# Patient Record
Sex: Female | Born: 1945
Health system: Southern US, Community
[De-identification: ages and names within clinical notes are randomized; demographics above are authoritative.]

## PROBLEM LIST (undated history)

## (undated) DIAGNOSIS — I2510 Atherosclerotic heart disease of native coronary artery without angina pectoris: Secondary | ICD-10-CM

## (undated) DIAGNOSIS — F32A Depression, unspecified: Secondary | ICD-10-CM

## (undated) DIAGNOSIS — M1990 Unspecified osteoarthritis, unspecified site: Secondary | ICD-10-CM

## (undated) DIAGNOSIS — K635 Polyp of colon: Secondary | ICD-10-CM

## (undated) DIAGNOSIS — D689 Coagulation defect, unspecified: Secondary | ICD-10-CM

## (undated) DIAGNOSIS — T7840XA Allergy, unspecified, initial encounter: Secondary | ICD-10-CM

## (undated) DIAGNOSIS — G4730 Sleep apnea, unspecified: Secondary | ICD-10-CM

## (undated) DIAGNOSIS — K219 Gastro-esophageal reflux disease without esophagitis: Secondary | ICD-10-CM

## (undated) DIAGNOSIS — I10 Essential (primary) hypertension: Secondary | ICD-10-CM

## (undated) DIAGNOSIS — K5790 Diverticulosis of intestine, part unspecified, without perforation or abscess without bleeding: Secondary | ICD-10-CM

## (undated) DIAGNOSIS — E079 Disorder of thyroid, unspecified: Secondary | ICD-10-CM

## (undated) DIAGNOSIS — K648 Other hemorrhoids: Secondary | ICD-10-CM

## (undated) DIAGNOSIS — F419 Anxiety disorder, unspecified: Secondary | ICD-10-CM

## (undated) DIAGNOSIS — F329 Major depressive disorder, single episode, unspecified: Secondary | ICD-10-CM

## (undated) DIAGNOSIS — D126 Benign neoplasm of colon, unspecified: Secondary | ICD-10-CM

## (undated) DIAGNOSIS — K602 Anal fissure, unspecified: Secondary | ICD-10-CM

## (undated) DIAGNOSIS — E785 Hyperlipidemia, unspecified: Secondary | ICD-10-CM

## (undated) HISTORY — DX: Atherosclerotic heart disease of native coronary artery without angina pectoris: I25.10

## (undated) HISTORY — DX: Diverticulosis of intestine, part unspecified, without perforation or abscess without bleeding: K57.90

## (undated) HISTORY — DX: Essential (primary) hypertension: I10

## (undated) HISTORY — DX: Anxiety disorder, unspecified: F41.9

## (undated) HISTORY — DX: Gastro-esophageal reflux disease without esophagitis: K21.9

## (undated) HISTORY — DX: Sleep apnea, unspecified: G47.30

## (undated) HISTORY — DX: Coagulation defect, unspecified: D68.9

## (undated) HISTORY — DX: Other hemorrhoids: K64.8

## (undated) HISTORY — DX: Depression, unspecified: F32.A

## (undated) HISTORY — DX: Unspecified osteoarthritis, unspecified site: M19.90

## (undated) HISTORY — DX: Benign neoplasm of colon, unspecified: D12.6

## (undated) HISTORY — DX: Allergy, unspecified, initial encounter: T78.40XA

## (undated) HISTORY — DX: Anal fissure, unspecified: K60.2

## (undated) HISTORY — DX: Polyp of colon: K63.5

## (undated) HISTORY — PX: OTHER SURGICAL HISTORY: SHX169

## (undated) HISTORY — DX: Disorder of thyroid, unspecified: E07.9

## (undated) HISTORY — DX: Hyperlipidemia, unspecified: E78.5

## (undated) HISTORY — PX: COLONOSCOPY: SHX174

---
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
## 2019-05-26 ENCOUNTER — Ambulatory Visit (INDEPENDENT_AMBULATORY_CARE_PROVIDER_SITE_OTHER): Payer: Self-pay | Admitting: Primary Care

## 2019-05-26 ENCOUNTER — Other Ambulatory Visit: Payer: Self-pay

## 2019-05-26 ENCOUNTER — Encounter: Payer: Self-pay | Admitting: Primary Care

## 2019-05-26 VITALS — BP 142/92 | HR 69 | Temp 97.7°F | Ht 64.5 in | Wt 152.8 lb

## 2019-05-26 DIAGNOSIS — M79672 Pain in left foot: Secondary | ICD-10-CM

## 2019-05-26 DIAGNOSIS — E039 Hypothyroidism, unspecified: Secondary | ICD-10-CM

## 2019-05-26 DIAGNOSIS — F411 Generalized anxiety disorder: Secondary | ICD-10-CM

## 2019-05-26 DIAGNOSIS — I10 Essential (primary) hypertension: Secondary | ICD-10-CM

## 2019-05-26 DIAGNOSIS — G8929 Other chronic pain: Secondary | ICD-10-CM | POA: Insufficient documentation

## 2019-05-26 LAB — COMPREHENSIVE METABOLIC PANEL
ALT: 13 U/L (ref 0–35)
AST: 15 U/L (ref 0–37)
Albumin: 4.6 g/dL (ref 3.5–5.2)
Alkaline Phosphatase: 73 U/L (ref 39–117)
BUN: 15 mg/dL (ref 6–23)
CO2: 27 mEq/L (ref 19–32)
Calcium: 9.8 mg/dL (ref 8.4–10.5)
Chloride: 102 mEq/L (ref 96–112)
Creatinine, Ser: 0.87 mg/dL (ref 0.40–1.20)
GFR: 63.59 mL/min (ref >60.00)
Glucose, Bld: 102 mg/dL — ABNORMAL HIGH (ref 70–99)
Potassium: 5.6 mEq/L — ABNORMAL HIGH (ref 3.5–5.1)
Sodium: 137 mEq/L (ref 135–145)
Total Bilirubin: 0.6 mg/dL (ref 0.2–1.2)
Total Protein: 6.6 g/dL (ref 6.0–8.3)

## 2019-05-26 LAB — LIPID PANEL
Cholesterol: 265 mg/dL — ABNORMAL HIGH (ref 0–200)
HDL: 83.6 mg/dL (ref >39.00)
LDL Cholesterol: 152 mg/dL — ABNORMAL HIGH (ref 0–99)
NonHDL: 181.86
Total CHOL/HDL Ratio: 3
Triglycerides: 150 mg/dL — ABNORMAL HIGH (ref 0.0–149.0)
VLDL: 30 mg/dL (ref 0.0–40.0)

## 2019-05-26 LAB — TSH: TSH: 1.59 u[IU]/mL (ref 0.35–4.50)

## 2019-05-26 MED ORDER — IRBESARTAN 75 MG PO TABS: 75.0000 mg | ORAL_TABLET | Freq: Every day | ORAL | 1 refills | Status: DC

## 2019-05-26 MED ORDER — SERTRALINE HCL 25 MG PO TABS: 25.0000 mg | ORAL_TABLET | Freq: Every day | ORAL | 1 refills | Status: DC

## 2019-05-26 NOTE — Progress Notes (Signed)
Subjective:    Patient ID: Bethany Sharp, female    DOB: 02/11/1946, 73 y.o.   MRN: [id number]  HPI  Bethany Sharp is a 73 year old female who presents today to establish care and discuss the problems mentioned below. Will obtain/review records. She speaks [place] mostly, her son is with her today to translate.   1) Essential Hypertension: Currently managed on irbesartan 75 mg daily. She ran out of her irbesartan one week ago.  She denies chest pain, headaches, dizziness.  BP Readings from Last 3 Encounters:  05/26/19 (!) 142/92   2) Hypothyroidism: Diagnosed originally 10 years ago. History of two thyroid nodules. Currently managed on levothyroxine 50 mcg. She has been managed on this same dose for years. She takes her levothyroxine in the morning with her other medications with her breakfast.   3) GERD: Currently managed on Nexium 20 mg for which she takes daily.   4) Chronic Foot Pain: Chronic to dorsal and plantar feet for years, takes Advil as needed. History of surgical intervention years ago, cartilage removed.   5) Chronic Anxiety: Previously managed on Bromazepam, now managed on alprazolam 0.25 mg for which she takes 1/2 tablet once to twice daily. She struggles with daily anxiety since moving to the [place] in October 2019.  Symptoms include feeling nervous, anxious, stressed. She has not been treated with SSRI medications in the past.   Review of Systems  Eyes: Negative for visual disturbance.  Respiratory: Negative for shortness of breath.   Cardiovascular: Negative for chest pain.  Neurological: Negative for dizziness and headaches.  Psychiatric/Behavioral: The patient is nervous/anxious.        Past Medical History:  Diagnosis Date  . Arthritis   . Depression   . Hyperlipidemia   . Thyroid disease      Social History   Socioeconomic History  . Marital status: Divorced    Spouse name: Not on file  . Number of children: Not on file  . Years of education: Not  on file  . Highest education level: Not on file  Occupational History  . Not on file  Social Needs  . Financial resource strain: Not on file  . Food insecurity    Worry: Not on file    Inability: Not on file  . Transportation needs    Medical: Not on file    Non-medical: Not on file  Tobacco Use  . Smoking status: Current Every Day Smoker  . Smokeless tobacco: Never Used  Substance and Sexual Activity  . Alcohol use: Yes  . Drug use: Not on file  . Sexual activity: Not on file  Lifestyle  . Physical activity    Days per week: Not on file    Minutes per session: Not on file  . Stress: Not on file  Relationships  . Social Herbalist on phone: Not on file    Gets together: Not on file    Attends religious service: Not on file    Active member of club or organization: Not on file    Attends meetings of clubs or organizations: Not on file    Relationship status: Not on file  . Intimate partner violence    Fear of current or ex partner: Not on file    Emotionally abused: Not on file    Physically abused: Not on file    Forced sexual activity: Not on file  Other Topics Concern  . Not on file  Social History  Narrative  . Not on file     No family history on file.  No Known Allergies  Current Outpatient Medications on File Prior to Visit  Medication Sig Dispense Refill  . levothyroxine (SYNTHROID) 50 MCG tablet Take 50 mcg by mouth daily before breakfast.     No current facility-administered medications on file prior to visit.     BP (!) 142/92   Pulse 69   Temp 97.7 F (36.5 C) (Temporal)   Ht 5' 4.5" (1.638 m)   Wt 152 lb 12 oz (69.3 kg)   SpO2 97%   BMI 25.81 kg/m    Objective:   Physical Exam  Constitutional: She appears well-nourished.  Neck: Neck supple.  Cardiovascular: Normal rate and regular rhythm.  Respiratory: Effort normal and breath sounds normal.  Skin: Skin is warm and dry.  Psychiatric: She has a normal mood and affect.            Assessment & Plan:

## 2019-05-26 NOTE — Assessment & Plan Note (Signed)
Chronic for years, history of foot surgery in [place] years ago.  Does well with as needed Advil.

## 2019-05-26 NOTE — Assessment & Plan Note (Signed)
Above goal in the office today, has been out of ibesartan for the last 1 week.  Refill of I restarted provided, although she does not have insurance so we may need to send her medications to [facility] and consider something on the $4 list.  Her son will update.

## 2019-05-26 NOTE — Assessment & Plan Note (Signed)
Chronic for years, has been on Xanax for 10 years total.  Discussed that daily benzo use is not appropriate for daily anxiety symptoms.  She will wean off of Xanax, instructions provided.  Start Zoloft 25 mg daily. Patient is to take 1/2 tablet daily for 8 days, then advance to 1 full tablet thereafter. We discussed possible side effects of headache, GI upset, drowsiness, and SI/HI. If thoughts of SI/HI develop, we discussed to present to the emergency immediately. Patient verbalized understanding.   She will update [place] in 4-6 weeks.

## 2019-05-26 NOTE — Patient Instructions (Signed)
Commencez  vous sevrer de Xanax. Prenez 1/2 comprim tous les deux soirs pendant deux semaines, puis arrtez.  Commencez par comprims de sertraline (Zoloft)  25 mg une fois par [employer]. Commencez par prendre 1/2 comprim par jour pendant six jours, puis augmentez  1 comprim complet par Insurance underwriter.  J'ai envoy une recharge de vos comprims d'irbsartan 75 mg  la pharmacie.  Prenez votre lvothyroxine (mdicament contre la thyrode) chaque matin  jeun avec de l'eau uniquement. Pas de nourriture ou d'autres mdicaments pendant 30 minutes.  Arrtez-vous  notre laboratoire aujourd'hui avant de [place]. Je vous appellerai avec vos rsultats de [employer] 1-2 jours.  Veuillez planifier une visite de suivi avec moi pendant six semaines pour [employer] de l'anxit.  C'tait trs agrable de vous rencontrer aujourd'hui!   Start to wean off of Xanax. Take 1/2 tablet every other night for two weeks, then stop.  Start sertraline (Zoloft) 25 mg tabelts once every morning for anxiety. Start by taking 1/2 tablet daily for six days, then increase to 1 full tablet thereafter.  I sent a refill of your irbesartan 75 mg tablets to the pharmacy.  Take your levothyroxine (Thyroid medication) every morning on an empty stomach with water only. No food or other medications for 30 minutes.  Stop by our lab today before you leave. I will call you with your lab results in 1-2 days.  Please schedule a follow up visit with me for six weeks to discuss anxiety.  It was lovely meeting you today!

## 2019-05-26 NOTE — Assessment & Plan Note (Signed)
Managed on levothyroxine 50 mcg for years without dose change.  She is taking her medication inappropriately, we discussed proper administration.  TSH pending today.  Will refill once TSH results have returned.  She has not run out of her medication.

## 2019-05-27 ENCOUNTER — Other Ambulatory Visit: Payer: Self-pay | Admitting: Primary Care

## 2019-05-27 DIAGNOSIS — E039 Hypothyroidism, unspecified: Secondary | ICD-10-CM

## 2019-05-27 MED ORDER — LEVOTHYROXINE SODIUM 50 MCG PO TABS: ORAL_TABLET | ORAL | 3 refills | Status: DC

## 2019-05-28 ENCOUNTER — Telehealth: Payer: Self-pay

## 2019-05-28 DIAGNOSIS — I10 Essential (primary) hypertension: Secondary | ICD-10-CM

## 2019-05-28 MED ORDER — LOSARTAN POTASSIUM 25 MG PO TABS: 25.0000 mg | ORAL_TABLET | Freq: Every day | ORAL | 1 refills | Status: DC

## 2019-05-28 NOTE — Telephone Encounter (Signed)
Please notify patient son that I sent in a prescription for losartan 25 mg to replace irbesartan. This was sent to [facility] and should be much more affordable. Would he like for [place] to send the levothyroxine to [facility] as well? What about the sertraline for anxiety?

## 2019-05-28 NOTE — Telephone Encounter (Signed)
Bethany Sharp (pt son's DPR signed) said that the BP med Irbesartan 75 mg is too expensive; Bethany Sharp said $75.00 for 1 month. Bethany Sharp understands there is a less expensive BP med at [facility] and request BP med be changed to less expensive med since pt has no ins. Please advise.

## 2019-05-28 NOTE — Telephone Encounter (Signed)
Spoke with patient's son and gave him all of the information.  He is going to contact [facility] and see if they can get the levothyroxine and sertraline any cheaper as well and let us know if we need to send new r/x's for those as well.  He thanks [place] for the call.

## 2020-03-27 ENCOUNTER — Ambulatory Visit: Payer: Self-pay | Attending: Internal Medicine

## 2020-03-27 DIAGNOSIS — Z20822 Contact with and (suspected) exposure to covid-19: Secondary | ICD-10-CM

## 2020-03-28 LAB — NOVEL CORONAVIRUS, NAA: SARS-CoV-2, NAA: NOT DETECTED

## 2020-03-28 LAB — SARS-COV-2, NAA 2 DAY TAT

## 2020-06-08 ENCOUNTER — Encounter: Payer: Self-pay | Admitting: Family Medicine

## 2020-06-08 ENCOUNTER — Other Ambulatory Visit: Payer: Self-pay

## 2020-06-08 ENCOUNTER — Ambulatory Visit: Payer: Self-pay | Admitting: Family Medicine

## 2020-06-08 ENCOUNTER — Ambulatory Visit (INDEPENDENT_AMBULATORY_CARE_PROVIDER_SITE_OTHER)
Admission: RE | Admit: 2020-06-08 | Discharge: 2020-06-08 | Disposition: A | Discharge status: Home / Self Care | Payer: Self-pay | Source: Ambulatory Visit | Attending: Family Medicine | Admitting: Family Medicine

## 2020-06-08 VITALS — BP 116/78 | HR 65 | Temp 97.6°F | Ht 64.0 in | Wt 154.0 lb

## 2020-06-08 DIAGNOSIS — M79672 Pain in left foot: Secondary | ICD-10-CM

## 2020-06-08 DIAGNOSIS — G8929 Other chronic pain: Secondary | ICD-10-CM

## 2020-06-08 DIAGNOSIS — M19072 Primary osteoarthritis, left ankle and foot: Secondary | ICD-10-CM

## 2020-06-08 MED ORDER — PREDNISONE 20 MG PO TABS: ORAL_TABLET | ORAL | 0 refills | Status: DC

## 2020-06-08 NOTE — Progress Notes (Addendum)
Bethany Kimbell T. Cailee Blanke, MD, [place]  [facility] and [place] at [facility] [place], [address]  Phone: [phone]  FAX: [phone]  Bethany Sharp - 74 y.o. female  MRN [id number]  Date of Birth: 07-May-1946  Date: 06/08/2020  PCP: [name], NP  Referral: [name], NP  Chief Complaint  Patient presents with  . Ankle Pain    left    This visit occurred during the SARS-CoV-2 public health emergency.  Safety protocols were in place, including screening questions prior to the visit, additional usage of staff PPE, and extensive cleaning of exam room while observing appropriate contact time as indicated for disinfecting solutions.   Subjective:   Bethany Sharp is a 74 y.o. very pleasant female patient with Body mass index is 26.43 kg/m. who presents with the following:  She presents with some chronic foot and ankle pain.  She is here with her son who provides additional history.  The patient speaks [place] and has low level command of English.  She has had some ongoing foot and ankle pain for years, and she did have what she describes as arthroscopy of the ankle with some debridement of cartilage while she was in [place] approximately 5 years ago.  She does have a history of having some flat feet long-term, and at this point she has worn some hard orthotics, but now they are more tender to palpation.  She did have a long trip to [place] recently and this was for several weeks, and this was approximately 6 weeks ago.  After her trip she has had quite a bit of pain predominantly in the midfoot.  S/p distant foot surgery in [place].   Went to [place] has been having problems as young and had an MRI.  S/p surgery distantly. 6 weeks ago.   Review of Systems is noted in the HPI, as appropriate   Objective:   BP 116/78   Pulse 65   Temp 97.6 F (36.4 C) (Temporal)   Ht 5\' 4"  (1.626 m)    Wt 154 lb (69.9 kg)   SpO2 97%   BMI 26.43 kg/m    GEN: No acute distress; alert,appropriate. PULM: Breathing comfortably in no respiratory distress PSYCH: Normally interactive.    Patient does have minimal tenderness at the toes as well as the forefoot.  She has had tenderness relatively diffusely at the tarsometatarsal joints.  She has no tenderness at the length of the fifth metatarsal.  She does have some tenderness predominantly at the joints throughout the midfoot and she has a predominantly large cuneiform.  She also has some tenderness with deep palpation at the talus.  She has quite a bit of significant loss of motion at the ankle.  Plantar flexion and dorsiflexion have approximately 50% loss of motion.  She has an approximate 35% loss of motion in eversion and inversion.  She also does have tenderness at the ankle as well as decreased strength in both flexion and dorsiflexion.  This is approximately 4 out of 5 in strength and worse in dorsiflexion.  Does have approximately 4+ strength in inversion and eversion.  These compared to the contralateral side which have full motion as well as 5/5 strength  Radiology: DG Foot Complete Left  Result Date: 06/08/2020 CLINICAL DATA:  Chronic foot pain EXAM: LEFT FOOT - COMPLETE 3+ VIEW COMPARISON:  None. FINDINGS: Mild degenerative changes at the first tarsal metatarsal articulation.  No acute fracture or dislocation is noted. No soft tissue abnormality is seen. Small calcaneal spurs are noted. Mild tibiotalar degenerative changes noted as well. IMPRESSION: Mild osteoarthritic changes without acute abnormality. Electronically Signed   By: Inez Catalina M.D.   On: 06/08/2020 18:33    I would characterize these as more of a moderate degree of osteoarthritis both at the ankle as well as in the midfoot. Electronically Signed  By: Owens Loffler, MD On: 06/08/2020 12:00 PM EDT   Assessment and Plan:     ICD-10-CM   1. Primary osteoarthritis,  left ankle and foot  M19.072 Ambulatory referral to Podiatry  2. Chronic foot pain, left  M79.672 DG Foot Complete Left   G89.29 Ambulatory referral to Podiatry   Clinically exacerbation of midfoot as well as ankle arthritis.  Exacerbated during trip to [place].  On my review, the patient has more moderate degree of midfoot as well as ankle osteoarthritis.  Clinically, the patient has very significant loss of motion and pain with terminal movements.  Work on ankle motion as she is able, she may be able to get a small degree back.  Acute prednisone to hopefully help with multiple joint involvement of the foot and ankle.  Addendum, 06/13/20 6:50 AM The patient's son called in today.  There was some confusion.  Bethany Sharp only speaks [place], and her son is also from [place].  Ultimately they decided that they wanted to see podiatry, but this was lost in translation.  I am happy to make this referral. Electronically Signed  By: Owens Loffler, MD On: 06/13/2020 6:50 AM   Follow-up: No follow-ups on file.  Meds ordered this encounter  Medications  . predniSONE (DELTASONE) 20 MG tablet    Sig: 2 tabs po daily for 5 days, then 1 tab po daily for 5 days    Dispense:  15 tablet    Refill:  0   Medications Discontinued During This Encounter  Medication Reason  . sertraline (ZOLOFT) 25 MG tablet Completed Course   Orders Placed This Encounter  Procedures  . DG Foot Complete Left  . Ambulatory referral to Podiatry    Signed,  Frederico Hamman T. Hebe Merriwether, MD   Outpatient Encounter Medications as of 06/08/2020  Medication Sig  . levothyroxine (SYNTHROID) 50 MCG tablet Take 1 tablet by mouth every morning on an empty stomach with water only.  No food or other medications for 30 minutes.  Marland Kitchen losartan (COZAAR) 25 MG tablet Take 1 tablet (25 mg total) by mouth daily. For blood pressure.  . predniSONE (DELTASONE) 20 MG tablet 2 tabs po daily for 5 days, then 1 tab po daily for 5 days  . [DISCONTINUED]  sertraline (ZOLOFT) 25 MG tablet Take 1 tablet (25 mg total) by mouth daily. For anxiety.   No facility-administered encounter medications on file as of 06/08/2020.

## 2020-06-08 NOTE — Patient Instructions (Signed)
Vous souffrez d'arthrite grave au pied et  la Henriette.  Pour le moment, je pense que nous devons faire quelque chose pour [employer] de manire prcise. Je vais vous donner de la prednisone. (strodes oraux). J'espre que cela pourra calmer l'inflammation plus rcente.   long terme, je pense que prendre de l'ibuprofne ou du tylenol quand a fait mal peut aider quand a agit.  Vous pouvez galement essayer le gel Voltaren en vente libre. C'est un vieil anti-inflammatoire qui est en suspension dans l'alcool, et que vous [employer] votre pied et votre cheville par voie topique.  Portez des chaussures confortables - tout ce qui vous Public relations account executive.

## 2020-06-12 ENCOUNTER — Telehealth: Payer: Self-pay | Admitting: Primary Care

## 2020-06-12 NOTE — Telephone Encounter (Signed)
I do not see that a referral has been ordered.  Will forward to Dr. Lorelei Pont.

## 2020-06-12 NOTE — Telephone Encounter (Signed)
Son Company secretary) called checking on referral to foot dr for pt for orthopedics. For foot pain   Please advise

## 2020-06-13 NOTE — Addendum Note (Signed)
Addended by: Owens Loffler on: 06/13/2020 06:50 AM   Modules accepted: Orders

## 2020-06-13 NOTE — Telephone Encounter (Signed)
There was some confusion here.  Native language is [place], so I think this may have been lost in translation.  Her son did mention possibility of podiatry evaluation, but I did not think that they wanted this right away.  Hopefully my recommendations will help, but it is entirely reasonable to discuss with podiatry.  I made this referral.

## 2020-06-30 ENCOUNTER — Other Ambulatory Visit: Payer: Self-pay

## 2020-06-30 ENCOUNTER — Ambulatory Visit (INDEPENDENT_AMBULATORY_CARE_PROVIDER_SITE_OTHER): Payer: Self-pay | Admitting: Podiatry

## 2020-06-30 ENCOUNTER — Encounter: Payer: Self-pay | Admitting: Podiatry

## 2020-06-30 DIAGNOSIS — Q666 Other congenital valgus deformities of feet: Secondary | ICD-10-CM

## 2020-06-30 DIAGNOSIS — M79673 Pain in unspecified foot: Secondary | ICD-10-CM

## 2020-07-05 ENCOUNTER — Encounter: Payer: Self-pay | Admitting: Podiatry

## 2020-07-05 NOTE — Progress Notes (Signed)
  Subjective:  Patient ID: Bethany Sharp, female    DOB: 1946-08-20,  MRN: [id number]  Chief Complaint  Patient presents with  . Foot Problem    the left foot has some arthritis and need some inserts made     74 y.o. female presents with the above complaint.  Patient presents with complaint of bilateral generalized foot pain.  Patient states that it hurts in the arch and the heel.  Patient states that is more dull achy in nature.  She states that happens injury at the end of the day.  She states that she would like to discuss shoe gear modification as well as inserts to help support the arch.  She usually stands in her feet all day.  She has presented today with a translator in the room.  She denies any other acute complaints.  She has not seen anyone else prior to seeing me.   Review of Systems: Negative except as noted in the HPI. Denies N/V/F/Ch.  Past Medical History:  Diagnosis Date  . Arthritis   . Depression   . Hyperlipidemia   . Thyroid disease     Current Outpatient Medications:  .  levothyroxine (SYNTHROID) 50 MCG tablet, Take 1 tablet by mouth every morning on an empty stomach with water only.  No food or other medications for 30 minutes., Disp: 90 tablet, Rfl: 3 .  losartan (COZAAR) 25 MG tablet, Take 1 tablet (25 mg total) by mouth daily. For blood pressure., Disp: 90 tablet, Rfl: 1 .  predniSONE (DELTASONE) 20 MG tablet, 2 tabs po daily for 5 days, then 1 tab po daily for 5 days, Disp: 15 tablet, Rfl: 0  Social History   Tobacco Use  Smoking Status Current Every Day Smoker  Smokeless Tobacco Never Used    No Known Allergies Objective:  There were no vitals filed for this visit. There is no height or weight on file to calculate BMI. Constitutional Well developed. Well nourished.  Vascular Dorsalis pedis pulses palpable bilaterally. Posterior tibial pulses palpable bilaterally. Capillary refill normal to all digits.  No cyanosis or clubbing noted. Pedal hair  growth normal.  Neurologic Normal speech. Oriented to person, place, and time. Epicritic sensation to light touch grossly present bilaterally.  Dermatologic Nails well groomed and normal in appearance. No open wounds. No skin lesions.  Orthopedic:  Subjective generalized pain on palpation/achiness noted to bilateral arches as well as the heel.  No concern for plantar fasciitis.  No Achilles tendinitis.  No peroneal, ATFL, posterior tibial pain.   Radiographs: None Assessment:   1. Pes planovalgus   2. Arch pain, unspecified laterality    Plan:  Patient was evaluated and treated and all questions answered.  Bilateral semiflexible pes planus -I explained patient the etiology of pes planus deformity and various treatment options were extensively discussed with the patient.  Given that she is having generalized arch pain as well as muscle ache especially at the end of the day I believe she will benefit from custom-made orthotics to help provide general support to the arches of the foot as well as control the hindfoot motion.  Patient agrees with the plan would like to proceed with custom-made orthotics. -She will be scheduled to see [facility] for custom-made orthotics.  She is aware of the financial cost.  No follow-ups on file.

## 2020-07-06 ENCOUNTER — Ambulatory Visit: Payer: Self-pay | Admitting: Orthotics

## 2020-07-06 ENCOUNTER — Other Ambulatory Visit: Payer: Self-pay

## 2020-07-06 DIAGNOSIS — Q666 Other congenital valgus deformities of feet: Secondary | ICD-10-CM

## 2020-07-06 DIAGNOSIS — M79673 Pain in unspecified foot: Secondary | ICD-10-CM

## 2020-07-06 NOTE — Progress Notes (Signed)
Cast today for f/o to address pes planovalgus, early stages of PTTD.   Plan on deep heel seat and 4* varus RF wedging to take pressure off PTT at navicular insertion.

## 2020-08-03 ENCOUNTER — Other Ambulatory Visit: Payer: Self-pay | Admitting: Orthotics

## 2020-08-24 ENCOUNTER — Ambulatory Visit (INDEPENDENT_AMBULATORY_CARE_PROVIDER_SITE_OTHER): Payer: Self-pay | Admitting: Orthotics

## 2020-08-24 ENCOUNTER — Other Ambulatory Visit: Payer: Self-pay

## 2020-08-24 DIAGNOSIS — M79673 Pain in unspecified foot: Secondary | ICD-10-CM

## 2020-08-24 NOTE — Progress Notes (Signed)
Make following adjustments: 1) more narrow, 2) no post 3) dress slim 4) medial skive 5) blue swirl 6) size 5.5.

## 2020-09-14 ENCOUNTER — Other Ambulatory Visit: Payer: Self-pay

## 2020-09-14 ENCOUNTER — Encounter: Payer: Self-pay | Admitting: Orthotics

## 2020-11-08 ENCOUNTER — Telehealth: Payer: Self-pay | Admitting: Primary Care

## 2020-11-15 ENCOUNTER — Other Ambulatory Visit: Payer: Self-pay | Admitting: Primary Care

## 2020-11-15 DIAGNOSIS — F411 Generalized anxiety disorder: Secondary | ICD-10-CM

## 2020-11-21 ENCOUNTER — Ambulatory Visit: Payer: Managed Care, Other (non HMO) | Admitting: Primary Care

## 2020-11-22 ENCOUNTER — Ambulatory Visit: Payer: Managed Care, Other (non HMO) | Admitting: Primary Care

## 2020-12-07 ENCOUNTER — Ambulatory Visit (INDEPENDENT_AMBULATORY_CARE_PROVIDER_SITE_OTHER): Payer: Managed Care, Other (non HMO) | Admitting: Primary Care

## 2020-12-07 ENCOUNTER — Encounter: Payer: Self-pay | Admitting: Primary Care

## 2020-12-07 ENCOUNTER — Other Ambulatory Visit: Payer: Self-pay

## 2020-12-07 VITALS — BP 122/62 | HR 82 | Temp 96.9°F | Ht 64.0 in | Wt 153.0 lb

## 2020-12-07 DIAGNOSIS — Z800 Family history of malignant neoplasm of digestive organs: Secondary | ICD-10-CM

## 2020-12-07 DIAGNOSIS — Z8249 Family history of ischemic heart disease and other diseases of the circulatory system: Secondary | ICD-10-CM

## 2020-12-07 DIAGNOSIS — R002 Palpitations: Secondary | ICD-10-CM

## 2020-12-07 DIAGNOSIS — R911 Solitary pulmonary nodule: Secondary | ICD-10-CM | POA: Insufficient documentation

## 2020-12-07 DIAGNOSIS — J3089 Other allergic rhinitis: Secondary | ICD-10-CM | POA: Insufficient documentation

## 2020-12-07 DIAGNOSIS — Z1211 Encounter for screening for malignant neoplasm of colon: Secondary | ICD-10-CM

## 2020-12-07 DIAGNOSIS — H938X3 Other specified disorders of ear, bilateral: Secondary | ICD-10-CM | POA: Insufficient documentation

## 2020-12-07 DIAGNOSIS — R0982 Postnasal drip: Secondary | ICD-10-CM

## 2020-12-07 DIAGNOSIS — F411 Generalized anxiety disorder: Secondary | ICD-10-CM | POA: Diagnosis not present

## 2020-12-07 DIAGNOSIS — I10 Essential (primary) hypertension: Secondary | ICD-10-CM

## 2020-12-07 DIAGNOSIS — E039 Hypothyroidism, unspecified: Secondary | ICD-10-CM | POA: Diagnosis not present

## 2020-12-07 MED ORDER — LEVOCETIRIZINE DIHYDROCHLORIDE 5 MG PO TABS: 5.0000 mg | ORAL_TABLET | Freq: Every evening | ORAL | 0 refills | Status: DC

## 2020-12-07 MED ORDER — FLUTICASONE PROPIONATE 50 MCG/ACT NA SUSP: 1.0000 | Freq: Two times a day (BID) | NASAL | 0 refills | Status: DC

## 2020-12-07 MED ORDER — SERTRALINE HCL 50 MG PO TABS: 50.0000 mg | ORAL_TABLET | Freq: Every day | ORAL | 0 refills | Status: DC

## 2020-12-07 NOTE — Assessment & Plan Note (Signed)
Compliant to levothyroxine 50 mcg.  Appears to have back supply from [place] as I have not filled this medication since August 2020 for 1 year supply.  Repeat TSH pending today. Continue levothyroxine 50 mcg for now.

## 2020-12-07 NOTE — Assessment & Plan Note (Signed)
Orders for noncontrasted CT chest placed.

## 2020-12-07 NOTE — Assessment & Plan Note (Signed)
Admits to never taking Zoloft when prescribed.  She is ready for treatment today, has not had Xanax in over 1 year  Prescription for Zoloft 50 mg sent to pharmacy. Patient is to take 1/2 tablet daily for 8 days, then advance to 1 full tablet thereafter. We discussed possible side effects of headache, GI upset, drowsiness, and SI/HI. If thoughts of SI/HI develop, we discussed to present to the emergency immediately. Patient verbalized understanding.   Follow up in 6 weeks for re-evaluation.

## 2020-12-07 NOTE — Assessment & Plan Note (Signed)
And sister who has since passed.Marland Kitchen Referral placed to GI for colonoscopy.  We will scan colonoscopy records into epic, results are in [place].

## 2020-12-07 NOTE — Assessment & Plan Note (Signed)
Occurring for 2 hours on Christmas Eve, also consuming alcohol at the time.  Infrequent symptoms since.  ECG today with normal sinus rhythm, heart rate of 64, no PAC/PVC.  No acute ST changes.  ECG today appears similar to ECG from 2018 in [place].  We will scan into chart.

## 2020-12-07 NOTE — Assessment & Plan Note (Addendum)
Exam today grossly normal. Referral placed to ENT per patient request.  Prescription for Flonase and Xyzal sent to pharmacy.

## 2020-12-07 NOTE — Assessment & Plan Note (Signed)
Unclear regarding duration, but certainly most bothersome over the last month after COVID-19 diagnosis.  She does have chronic ear fullness and sinus congestion.  Prescription for Flonase and Xyzal sent to pharmacy. Referral placed to ENT per patient request.

## 2020-12-07 NOTE — Assessment & Plan Note (Signed)
Occurring in mother in her 74s.  Given her symptoms of palpitations, chest pain, family history we will send her to cardiology for evaluation.  She does come with records from cardiology in 2018, appears to be EKG and echocardiogram.  We will scanned into epic, however results are in [place].

## 2020-12-07 NOTE — Patient Instructions (Addendum)
Vous allez recevoir  appels tlphoniques multiples pour [employer] poumons, pour [employer], pour [employer] spcialiste ORL et pour [employer].  Commencez  utiliser le vaporisateur nasal de fluticasone (Flonase) pour les oreilles pleines. 1 pulvrisation dans chaque narine deux fois par [employer].  Commencez  prendre de la lvoctirizine (Xyzal) 5 mg une fois par jour [employer] drainage de la gorge [employer].  Commencez  prendre de la sertraline (Zoloft) 50 mg pour l'anxit. Prendre 1/2 comprim par voie orale une fois par jour pendant une semaine, puis augmenter  1 comprim entier par [employer].  Veuillez vous rendre au laboratoire aujourd'hui.  [place] programmer une visite de suivi avec moi pendant 2 mois.  C'tait un plaisir de vous voir aujourd'hui !     You will receive multiple phone calls for the CT scan of your lungs, for the colonoscopy, for the ENT specialist, and for the cardiologist.  Start using fluticasone (Flonase) nasal spray for ear fullness. 1 spray in each nostril twice daily.  Start taking levocetirizine (Xyzal) 5 mg once daily for throat drainage and mucous.  Start taking sertraline (Zoloft) 50 mg for anxiety. Take 1/2 tablet by mouth once daily for one week, then increase to 1 full tablet thereafter.  Please go to the lab today.  Please schedule a follow up visit with me for 2 months.  It was lovely to see you today!

## 2020-12-07 NOTE — Progress Notes (Signed)
Subjective:    Patient ID: Bethany Sharp, female    DOB: 1946-05-22, 75 y.o.   MRN: [id number]  HPI  This visit occurred during the SARS-CoV-2 public health emergency.  Safety protocols were in place, including screening questions prior to the visit, additional usage of staff PPE, and extensive cleaning of exam room while observing appropriate contact time as indicated for disinfecting solutions.   Bethany Sharp is a 75 year old female with a history of hypertension, hypothyroidism, anxiety disorder who presents today for follow-up and to discuss anxiety.  I have not seen her since August 2020 during her establish care visit.  The patient speaks [place] only, has a friend with her who is translating.  She has written a 4 page letter in [place] discussing her symptoms and requests today.  1) Hypothyroidism: Currently managed on levothyroxine 50 mcg. Last TSH on file was from August 2020 with a level of 1.59. She endorses compliance to levothyroxine 50 mcg since her last visit, her friend mentions that she had a back supply of levothyroxine from [place].  2) Essential Hypertension: Currently managed on losartan 25 mg.  She denies dizziness, headaches.  She has noticed palpitations, see below.  BP Readings from Last 3 Encounters:  12/07/20 122/62  06/08/20 116/78  05/26/19 (!) 142/92     3) GAD: Chronic for years.  During our establish care visit in August 2020 she was taking Xanax daily.  We discussed that daily benzo use was not best practice for anxiety treatment, especially in the elderly patient.  We initiated sertraline 25 mg and she was advised to follow-up.  Her letter states that she never took the sertraline, also stopped her Xanax over a year ago.  Since then she has struggled with anxiety, "I cannot do things calmly".  She suspects her anxiety has become a trigger for her esophageal reflux.  Daily symptoms of feeling anxious, nervous, stressed. She's been in the [place] for three years, has  bought a house and has obtained her driver's license.  Her son has lived [place] the states for years, has encouraged her to seek help for her anxiety.  4) Ear Fullness/Nasal Congestion: Her letter today also mentions "ears are blocked almost permanently", dry nose during the day and evening, nasal congestion, postnasal drip and clears her throat often.  She was diagnosed with COVID-19 about 1 month ago, and has had trouble expectorating mucus from her throat daily.   Also with ringing in the ears. She would like to see ENT.   5) Sleep Apnea: Diagnosed about 8 years ago in [place], has a breathing apparatus and does well.  6) Family History of Colon Cancer: Family history of colon cancer in her sister.  She has undergone screening colonoscopy every 1 year due to polyps that are removed regularly.  She has not undergone colonoscopy since mid 2020 in [place].  She has her records today, results are in [place].  7) Lung Nodule: Chronic to left lung which was diagnosed about 3 years ago. She believes that she may need an updated scan.  She is uncertain if the nodule has been deemed stable.  8) Palpitations: Initially occurring at Christmas Eve in 2021, lasted for about 2 hours, was drinking alcohol at the time, since then has noticed infrequent symptoms.    Family history of mother who died in her 63s from aortic dissection.  Also with chest pain is intermittent, occurs to various parts of bilateral chest. She denies nausea, diaphoresis.  She has seen  cardiology in the past, has records with her from [place] which include EKG and echocardiogram.  Results are in [place].  BP Readings from Last 3 Encounters:  12/07/20 122/62  06/08/20 116/78  05/26/19 (!) 142/92     Review of Systems  Constitutional: Negative for fatigue.  HENT: Positive for congestion and postnasal drip.        Ear fullness  Respiratory: Negative for shortness of breath.        Lung nodule  Cardiovascular: Positive for chest pain.        See HPI  Gastrointestinal:       Esophageal reflux symptoms  Neurological: Negative for dizziness and headaches.       Past Medical History:  Diagnosis Date  . Arthritis   . Depression   . Hyperlipidemia   . Thyroid disease      Social History   Socioeconomic History  . Marital status: Divorced    Spouse name: Not on file  . Number of children: Not on file  . Years of education: Not on file  . Highest education level: Not on file  Occupational History  . Not on file  Tobacco Use  . Smoking status: Current Every Day Smoker  . Smokeless tobacco: Never Used  Substance and Sexual Activity  . Alcohol use: Yes  . Drug use: Not on file  . Sexual activity: Not on file  Other Topics Concern  . Not on file  Social History Narrative  . Not on file   Social Determinants of Health   Financial Resource Strain: Not on file  Food Insecurity: Not on file  Transportation Needs: Not on file  Physical Activity: Not on file  Stress: Not on file  Social Connections: Not on file  Intimate Partner Violence: Not on file    Past Surgical History:  Procedure Laterality Date  . APPENDECTOMY  1960    History reviewed. No pertinent family history.  No Known Allergies  Current Outpatient Medications on File Prior to Visit  Medication Sig Dispense Refill  . levothyroxine (SYNTHROID) 50 MCG tablet Take 1 tablet by mouth every morning on an empty stomach with water only.  No food or other medications for 30 minutes. 90 tablet 3  . losartan (COZAAR) 25 MG tablet Take 1 tablet (25 mg total) by mouth daily. For blood pressure. 90 tablet 1  . predniSONE (DELTASONE) 20 MG tablet 2 tabs po daily for 5 days, then 1 tab po daily for 5 days (Patient not taking: No sig reported) 15 tablet 0   No current facility-administered medications on file prior to visit.    BP 122/62   Pulse 82   Temp (!) 96.9 F (36.1 C) (Temporal)   Ht 5\' 4"  (1.626 m)   Wt 153 lb (69.4 kg)   SpO2 97%   BMI  26.26 kg/m    Objective:   Physical Exam Constitutional:      Appearance: She is well-nourished.  HENT:     Right Ear: Tympanic membrane normal. Tympanic membrane is not erythematous or bulging.     Left Ear: Tympanic membrane is retracted. Tympanic membrane is not erythematous or bulging.  Cardiovascular:     Rate and Rhythm: Normal rate and regular rhythm.  Pulmonary:     Effort: Pulmonary effort is normal.     Breath sounds: Normal breath sounds.  Musculoskeletal:     Cervical back: Neck supple.  Skin:    General: Skin is warm and dry.  Psychiatric:  Mood and Affect: Mood and affect normal.            Assessment & Plan:

## 2020-12-07 NOTE — Assessment & Plan Note (Signed)
Well-controlled in the office today on losartan 25 mg, CMP pending.  Continue losartan 25 mg daily.

## 2020-12-08 LAB — CBC
HCT: 41 % (ref 36.0–46.0)
Hemoglobin: 13.8 g/dL (ref 12.0–15.0)
MCHC: 33.7 g/dL (ref 30.0–36.0)
MCV: 90.1 fl (ref 78.0–100.0)
Platelets: 273 10*3/uL (ref 150.0–400.0)
RBC: 4.55 Mil/uL (ref 3.87–5.11)
RDW: 13.6 % (ref 11.5–15.5)
WBC: 7.2 10*3/uL (ref 4.0–10.5)

## 2020-12-08 LAB — LIPID PANEL
Cholesterol: 242 mg/dL — ABNORMAL HIGH (ref 0–200)
HDL: 77.2 mg/dL (ref >39.00)
NonHDL: 164.82
Total CHOL/HDL Ratio: 3
Triglycerides: 286 mg/dL — ABNORMAL HIGH (ref 0.0–149.0)
VLDL: 57.2 mg/dL — ABNORMAL HIGH (ref 0.0–40.0)

## 2020-12-08 LAB — COMPREHENSIVE METABOLIC PANEL
ALT: 13 U/L (ref 0–35)
AST: 17 U/L (ref 0–37)
Albumin: 4.4 g/dL (ref 3.5–5.2)
Alkaline Phosphatase: 79 U/L (ref 39–117)
BUN: 11 mg/dL (ref 6–23)
CO2: 28 mEq/L (ref 19–32)
Calcium: 10.1 mg/dL (ref 8.4–10.5)
Chloride: 102 mEq/L (ref 96–112)
Creatinine, Ser: 0.82 mg/dL (ref 0.40–1.20)
GFR: 69.75 mL/min (ref >60.00)
Glucose, Bld: 107 mg/dL — ABNORMAL HIGH (ref 70–99)
Potassium: 5 mEq/L (ref 3.5–5.1)
Sodium: 139 mEq/L (ref 135–145)
Total Bilirubin: 0.5 mg/dL (ref 0.2–1.2)
Total Protein: 6.7 g/dL (ref 6.0–8.3)

## 2020-12-08 LAB — LDL CHOLESTEROL, DIRECT: Direct LDL: 132 mg/dL

## 2020-12-08 LAB — HEMOGLOBIN A1C: Hgb A1c MFr Bld: 5.6 % (ref 4.6–6.5)

## 2020-12-08 LAB — TSH: TSH: 0.8 u[IU]/mL (ref 0.35–4.50)

## 2020-12-11 DIAGNOSIS — E785 Hyperlipidemia, unspecified: Secondary | ICD-10-CM

## 2020-12-12 MED ORDER — ATORVASTATIN CALCIUM 10 MG PO TABS: 10.0000 mg | ORAL_TABLET | Freq: Every day | ORAL | 3 refills | Status: DC

## 2020-12-14 ENCOUNTER — Ambulatory Visit: Payer: Managed Care, Other (non HMO) | Admitting: Internal Medicine

## 2020-12-15 ENCOUNTER — Telehealth: Payer: Self-pay | Admitting: Primary Care

## 2020-12-15 NOTE — Telephone Encounter (Signed)
Noted  

## 2020-12-15 NOTE — Telephone Encounter (Signed)
Im sorry I have not idea what this means. Did they need something from [place]

## 2020-12-15 NOTE — Telephone Encounter (Signed)
The son was able to find a place to have her ct scan that her insurance would cover it . She is having it a [employer]product on 01/01/21

## 2020-12-18 ENCOUNTER — Ambulatory Visit: Payer: Managed Care, Other (non HMO) | Admitting: Internal Medicine

## 2020-12-18 ENCOUNTER — Encounter: Payer: Self-pay | Admitting: *Deleted

## 2020-12-18 ENCOUNTER — Encounter: Payer: Self-pay | Admitting: Internal Medicine

## 2020-12-18 ENCOUNTER — Other Ambulatory Visit: Payer: Self-pay

## 2020-12-18 ENCOUNTER — Ambulatory Visit (INDEPENDENT_AMBULATORY_CARE_PROVIDER_SITE_OTHER): Payer: Managed Care, Other (non HMO)

## 2020-12-18 VITALS — BP 138/70 | HR 66 | Ht 64.0 in | Wt 151.0 lb

## 2020-12-18 DIAGNOSIS — R002 Palpitations: Secondary | ICD-10-CM

## 2020-12-18 DIAGNOSIS — Z8249 Family history of ischemic heart disease and other diseases of the circulatory system: Secondary | ICD-10-CM

## 2020-12-18 DIAGNOSIS — R0609 Other forms of dyspnea: Secondary | ICD-10-CM

## 2020-12-18 DIAGNOSIS — R0600 Dyspnea, unspecified: Secondary | ICD-10-CM | POA: Diagnosis not present

## 2020-12-18 DIAGNOSIS — I10 Essential (primary) hypertension: Secondary | ICD-10-CM | POA: Diagnosis not present

## 2020-12-18 MED ORDER — METOPROLOL TARTRATE 50 MG PO TABS
ORAL_TABLET | ORAL | 0 refills | Status: DC
Start: 2020-12-18 — End: 2021-03-29

## 2020-12-18 NOTE — Progress Notes (Addendum)
Cardiology Office Note:    Date:  12/18/2020   ID:  Bethany Sharp, DOB [birth year], MRN [id number]  PCP:  [name], NP   [place]  Cardiologist:  No primary care provider on file.  Advanced Practice Provider:  No care team member to display Electrophysiologist:  None       CC: Palpitations and get checked out Consulted for the evaluation of palpitations at the behest of Bethany Sharp, Bethany Penna, NP  Interpretor Service Offered; deferred for son  History of Present Illness:    Bethany Sharp is a 75 y.o. female with a hx of HLD, tobacco abuse, and family history of aortic dissection who presents for evaluation 12/18/20.  Patient notes that she is feeling well for most of her life.  Received most of her health care in [place] despite living here from some time:  Finally has social security coverage here.  Has had no chest pain, chest pressure, chest tightness, chest stinging.  Notes that she has has some shortness of breath with exertion from time to time.  No SOB at rest.  No PND or orthopnea. No weight gain, LE edema, abdominal swelling.  Notes that she has has palpitations for years, that has increased since Christmas.  At that time had a 2 hour episode.  Since this has had frequent palpitations at least once a week. No syncope or near syncope.  Patient reports prior cardiac testing including 2018 echo in [place].  Past Medical History:  Diagnosis Date  . Arthritis   . Depression   . Hyperlipidemia   . Thyroid disease     Past Surgical History:  Procedure Laterality Date  . APPENDECTOMY  1960    Current Medications: Current Meds  Medication Sig  . atorvastatin (LIPITOR) 10 MG tablet Take 1 tablet (10 mg total) by mouth daily. For cholesterol.  . fluticasone (FLONASE) 50 MCG/ACT nasal spray Place 1 spray into both nostrils 2 (two) times daily.  . irbesartan (AVAPRO) 75 MG tablet Take 75 mg by mouth daily.  Marland Kitchen levocetirizine (XYZAL) 5 MG  tablet Take 1 tablet (5 mg total) by mouth every evening. For allergies.  Marland Kitchen levothyroxine (SYNTHROID) 50 MCG tablet Take 1 tablet by mouth every morning on an empty stomach with water only.  No food or other medications for 30 minutes.  . metoprolol tartrate (LOPRESSOR) 50 MG tablet Take 2 hours prior to Cardiac CT  . sertraline (ZOLOFT) 50 MG tablet Take 1 tablet (50 mg total) by mouth daily. For anxiety     Allergies:   Patient has no known allergies.   Social History   Socioeconomic History  . Marital status: Divorced    Spouse name: Not on file  . Number of children: Not on file  . Years of education: Not on file  . Highest education level: Not on file  Occupational History  . Not on file  Tobacco Use  . Smoking status: Current Every Day Smoker  . Smokeless tobacco: Never Used  Substance and Sexual Activity  . Alcohol use: Yes  . Drug use: Not on file  . Sexual activity: Not on file  Other Topics Concern  . Not on file  Social History Narrative  . Not on file   Social Determinants of Health   Financial Resource Strain: Not on file  Food Insecurity: Not on file  Transportation Needs: Not on file  Physical Activity: Not on file  Stress: Not on file  Social Connections: Not on  file     Addendum: patient presented with son, Bethany Sharp.  Family History: Grandfather died at age 4 from PE Grandmother died at age 56 from an aortic dissection. Sister died from colon cancer.  ROS:   Please see the history of present illness.     All other systems reviewed and are negative.  EKGs/Labs/Other Studies Reviewed:    The following studies were reviewed today:  EKG:   12/07/20: SR 64  Recent Labs: 12/07/2020: ALT 13; BUN 11; Creatinine, Ser 0.82; Hemoglobin 13.8; Platelets 273.0; Potassium 5.0; Sodium 139; TSH 0.80  Recent Lipid Panel    Component Value Date/Time   CHOL 242 (H) 12/07/2020 1620   TRIG 286.0 (H) 12/07/2020 1620   HDL 77.20 12/07/2020 1620   CHOLHDL 3  12/07/2020 1620   VLDL 57.2 (H) 12/07/2020 1620   LDLCALC 152 (H) 05/26/2019 1203   LDLDIRECT 132.0 12/07/2020 1620    Risk Assessment/Calculations:     N/A  Physical Exam:    VS:  BP 138/70   Pulse 66   Ht 5\' 4"  (1.626 m)   Wt 151 lb (68.5 kg)   SpO2 97%   BMI 25.92 kg/m     Wt Readings from Last 3 Encounters:  12/18/20 151 lb (68.5 kg)  12/07/20 153 lb (69.4 kg)  06/08/20 154 lb (69.9 kg)     GEN:  Well nourished, well developed in no acute distress HEENT: Normal NECK: No JVD; No carotid bruits LYMPHATICS: No lymphadenopathy CARDIAC: RRR, no murmurs, rubs, gallops RESPIRATORY:  Clear to auscultation without rales, wheezing or rhonchi  ABDOMEN: Soft, non-tender, non-distended MUSCULOSKELETAL:  No edema; No deformity  SKIN: Warm and dry NEUROLOGIC:  Alert and oriented x 3 PSYCHIATRIC:  Normal affect   ASSESSMENT:    1. DOE (dyspnea on exertion)   2. Family history of aortic dissection   3. Palpitations    PLAN:    In order of problems listed above:  Palpitations; possible SVT - will obtain 14 day-day non live heart monitor (ZioPatch)  DOE concerning for angina equivalent Family history of aortic dissection - The patient presents with possibly cardiac pain - Would recommend CCTA with possible FFR as needed to exclude obstructive CAD and to assess for non-obstructive CAD requiring secondary prevention; as well as to evaluate thoracic aortic dilation - This may also assess the patient pulmonary nodule that Ms. Bethany Sharp was planning; will reach out to primary NP  Who may be able to use this data as well.   Summer to Fall follow up unless new symptoms or abnormal test results warranting change in plan    Medication Adjustments/Labs and Tests Ordered: Current medicines are reviewed at length with the patient today.  Concerns regarding medicines are outlined above.  Orders Placed This Encounter  Procedures  . CT CORONARY MORPH W/CTA COR W/SCORE W/CA W/CM &/OR  WO/CM  . CT CORONARY FRACTIONAL FLOW RESERVE DATA PREP  . CT CORONARY FRACTIONAL FLOW RESERVE FLUID ANALYSIS  . Basic metabolic panel  . LONG TERM MONITOR (3-14 DAYS)   Meds ordered this encounter  Medications  . metoprolol tartrate (LOPRESSOR) 50 MG tablet    Sig: Take 2 hours prior to Cardiac CT    Dispense:  1 tablet    Refill:  0    Patient Instructions  Medication Instructions:  Your physician recommends that you continue on your current medications as directed. Please refer to the Current Medication list given to you today.  *If you need a refill on  your cardiac medications before your next appointment, please call your pharmacy*   Lab Work: NONE If you have labs (blood work) drawn today and your tests are completely normal, you will receive your results only by: Marland Kitchen MyChart Message (if you have MyChart) OR . A paper copy in the mail If you have any lab test that is abnormal or we need to change your treatment, we will call you to review the results.   Testing/Procedures: Your physician has requested that you have a Cardiac CT.    Follow-Up: At [facility], you and your health needs are our priority.  As part of our continuing mission to provide you with exceptional heart care, we have created designated Provider Care Teams.  These Care Teams include your primary Cardiologist (physician) and Advanced Practice Providers (APPs -  Physician Assistants and Nurse Practitioners) who all work together to provide you with the care you need, when you need it.  We recommend signing up for the patient portal called "MyChart".  Sign up information is provided on this After Visit Summary.  MyChart is used to connect with patients for Virtual Visits (Telemedicine).  Patients are able to view lab/test results, encounter notes, upcoming appointments, etc.  Non-urgent messages can be sent to your provider as well.   To learn more about what you can do with MyChart, go to  [email or web address].    Your next appointment:   5-7 month(s)  The format for your next appointment:   In Person  Provider:   You may see Gasper Sells, MD or one of the following Advanced Practice Providers on your designated Care Team:    Melina Copa, PA-C  Ermalinda Barrios, PA-C    Other Instructions  Bryn Gulling- Long Term Monitor Instructions   Your physician has requested you wear your ZIO patch monitor__14__days.   This is a single patch monitor.  [employer] supplies one patch monitor per enrollment.  Additional stickers are not available.   Please do not apply patch if you will be having a Nuclear Stress Test, Echocardiogram, Cardiac CT, MRI, or Chest Xray during the time frame you would be wearing the monitor. The patch cannot be worn during these tests.  You cannot remove and re-apply the ZIO XT patch monitor.   Your ZIO patch monitor will be sent USPS Priority mail from [facility] directly to your home address. The monitor may also be mailed to a PO BOX if home delivery is not available.   It may take 3-5 days to receive your monitor after you have been enrolled.   Once you have received you monitor, please review enclosed instructions.  Your monitor has already been registered assigning a specific monitor serial # to you.   Applying the monitor   Shave hair from upper left chest.   Hold abrader disc by orange tab.  Rub abrader in 40 strokes over left upper chest as indicated in your monitor instructions.   Clean area with 4 enclosed alcohol pads .  Use all pads to assure are is cleaned thoroughly.  Let dry.   Apply patch as indicated in monitor instructions.  Patch will be place under collarbone on left side of chest with arrow pointing upward.   Rub patch adhesive wings for 2 minutes.Remove white label marked "1".  Remove white label marked "2".  Rub patch adhesive wings for 2 additional minutes.   While looking in a mirror, press and release button in center  of patch.  A small green light  will flash 3-4 times .  This will be your only indicator the monitor has been turned on.     Do not shower for the first 24 hours.  You may shower after the first 24 hours.   Press button if you feel a symptom. You will hear a small click.  Record Date, Time and Symptom in the Patient Log Book.   When you are ready to remove patch, follow instructions on last 2 pages of Patient Log Book.  Stick patch monitor onto last page of Patient Log Book.   Place Patient Log Book in [place] box.  Use locking tab on box and tape box closed securely.  The [employer] and [employer] has [employer] on it.  Please place in mailbox as soon as possible.  Your physician should have your test results approximately 7 days after the monitor has been mailed back to [facility].   Call [place] at [phone] if you have questions regarding your ZIO XT patch monitor.  Call them immediately if you see an orange light blinking on your monitor.   If your monitor falls off in less than 4 days contact our Monitor department at [phone].  If your monitor becomes loose or falls off after 4 days call [employer] at [phone] for suggestions on securing your monitor.   Your cardiac CT will be scheduled at one of the below locations:   [facility] [address] [place], [place] [address] [phone]   Please arrive at the [facility] main entrance (entrance A) of [facility] 30 minutes prior to test start time. Proceed to the [facility] Radiology Department (first floor) to check-in and test prep.   Please follow these instructions carefully (unless otherwise directed):   On the Night Before the Test: . Be sure to Drink plenty of water. . Do not consume any caffeinated/decaffeinated beverages or chocolate 12 hours prior to your test. . Do not take any antihistamines 12 hours prior to your test. (Xyzal and Flonase)   On the Day of the  Test: . Drink plenty of water until 1 hour prior to the test. . Do not eat any food 4 hours prior to the test. . You may take your regular medications prior to the test.  . Take metoprolol (Lopressor)50mg   two hours prior to test. . FEMALES- please wear underwire-free bra if available        After the Test: . Drink plenty of water. . After receiving IV contrast, you may experience a mild flushed feeling. This is normal. . On occasion, you may experience a mild rash up to 24 hours after the test. This is not dangerous. If this occurs, you can take Benadryl 25 mg and increase your fluid intake. . If you experience trouble breathing, this can be serious. If it is severe call 911 IMMEDIATELY. If it is mild, please call our office. . If you take any of these medications: Glipizide/Metformin, Avandament, Glucavance, please do not take 48 hours after completing test unless otherwise instructed.   Once we have confirmed authorization from your insurance company, we will call you to set up a date and time for your test. Based on how quickly your insurance processes prior authorizations requests, please allow up to 4 weeks to be contacted for scheduling your Cardiac CT appointment. Be advised that routine Cardiac CT appointments could be scheduled as many as 8 weeks after your provider has ordered it.  For non-scheduling related questions,  please contact the cardiac imaging nurse navigator should you have any questions/concerns: Marchia Bond, Cardiac Imaging Nurse Navigator Gordy Clement, Cardiac Imaging Nurse Navigator [facility] Direct Office Dial: [phone]   For scheduling needs, including cancellations and rescheduling, please call [place], [phone].       Signed, Werner Lean, MD  12/18/2020 5:51 PM    [facility]

## 2020-12-18 NOTE — Progress Notes (Signed)
Patient ID: Bethany Sharp, female   DOB: 09/03/1946, 75 y.o.   MRN: [id number] Patient enrolled for [employer] to ship a 14 day ZIO XT long term holter monitor to her home.

## 2020-12-18 NOTE — Patient Instructions (Addendum)
Medication Instructions:  Your physician recommends that you continue on your current medications as directed. Please refer to the Current Medication list given to you today.  *If you need a refill on your cardiac medications before your next appointment, please call your pharmacy*   Lab Work: NONE If you have labs (blood work) drawn today and your tests are completely normal, you will receive your results only by: Marland Kitchen MyChart Message (if you have MyChart) OR . A paper copy in the mail If you have any lab test that is abnormal or we need to change your treatment, we will call you to review the results.   Testing/Procedures: Your physician has requested that you have a Cardiac CT.    Follow-Up: At [facility], you and your health needs are our priority.  As part of our continuing mission to provide you with exceptional heart care, we have created designated Provider Care Teams.  These Care Teams include your primary Cardiologist (physician) and Advanced Practice Providers (APPs -  Physician Assistants and Nurse Practitioners) who all work together to provide you with the care you need, when you need it.  We recommend signing up for the patient portal called "MyChart".  Sign up information is provided on this After Visit Summary.  MyChart is used to connect with patients for Virtual Visits (Telemedicine).  Patients are able to view lab/test results, encounter notes, upcoming appointments, etc.  Non-urgent messages can be sent to your provider as well.   To learn more about what you can do with MyChart, go to [email or web address].    Your next appointment:   5-7 month(s)  The format for your next appointment:   In Person  Provider:   You may see Gasper Sells, MD or one of the following Advanced Practice Providers on your designated Care Team:    Melina Copa, PA-C  Ermalinda Barrios, PA-C    Other Instructions  Bryn Gulling- Long Term Monitor Instructions   Your physician has  requested you wear your ZIO patch monitor__14__days.   This is a single patch monitor.  [employer] supplies one patch monitor per enrollment.  Additional stickers are not available.   Please do not apply patch if you will be having a Nuclear Stress Test, Echocardiogram, Cardiac CT, MRI, or Chest Xray during the time frame you would be wearing the monitor. The patch cannot be worn during these tests.  You cannot remove and re-apply the ZIO XT patch monitor.   Your ZIO patch monitor will be sent USPS Priority mail from [facility] directly to your home address. The monitor may also be mailed to a PO BOX if home delivery is not available.   It may take 3-5 days to receive your monitor after you have been enrolled.   Once you have received you monitor, please review enclosed instructions.  Your monitor has already been registered assigning a specific monitor serial # to you.   Applying the monitor   Shave hair from upper left chest.   Hold abrader disc by orange tab.  Rub abrader in 40 strokes over left upper chest as indicated in your monitor instructions.   Clean area with 4 enclosed alcohol pads .  Use all pads to assure are is cleaned thoroughly.  Let dry.   Apply patch as indicated in monitor instructions.  Patch will be place under collarbone on left side of chest with arrow pointing upward.   Rub patch adhesive wings for 2 minutes.Remove white label marked "1".  Remove white  label marked "2".  Rub patch adhesive wings for 2 additional minutes.   While looking in a mirror, press and release button in center of patch.  A small green light will flash 3-4 times .  This will be your only indicator the monitor has been turned on.     Do not shower for the first 24 hours.  You may shower after the first 24 hours.   Press button if you feel a symptom. You will hear a small click.  Record Date, Time and Symptom in the Patient Log Book.   When you are ready to remove patch, follow  instructions on last 2 pages of Patient Log Book.  Stick patch monitor onto last page of Patient Log Book.   Place Patient Log Book in [place] box.  Use locking tab on box and tape box closed securely.  The [employer] and [employer] has [employer] on it.  Please place in mailbox as soon as possible.  Your physician should have your test results approximately 7 days after the monitor has been mailed back to [facility].   Call [place] at [phone] if you have questions regarding your ZIO XT patch monitor.  Call them immediately if you see an orange light blinking on your monitor.   If your monitor falls off in less than 4 days contact our Monitor department at [phone].  If your monitor becomes loose or falls off after 4 days call [employer] at [phone] for suggestions on securing your monitor.   Your cardiac CT will be scheduled at one of the below locations:   [facility] [address] [place], [place] [address] [phone]   Please arrive at the [facility] main entrance (entrance A) of [facility] 30 minutes prior to test start time. Proceed to the [facility] Radiology Department (first floor) to check-in and test prep.   Please follow these instructions carefully (unless otherwise directed):   On the Night Before the Test: . Be sure to Drink plenty of water. . Do not consume any caffeinated/decaffeinated beverages or chocolate 12 hours prior to your test. . Do not take any antihistamines 12 hours prior to your test. (Xyzal and Flonase)   On the Day of the Test: . Drink plenty of water until 1 hour prior to the test. . Do not eat any food 4 hours prior to the test. . You may take your regular medications prior to the test.  . Take metoprolol (Lopressor)50mg   two hours prior to test. . FEMALES- please wear underwire-free bra if available        After the Test: . Drink plenty of water. . After receiving IV contrast, you  may experience a mild flushed feeling. This is normal. . On occasion, you may experience a mild rash up to 24 hours after the test. This is not dangerous. If this occurs, you can take Benadryl 25 mg and increase your fluid intake. . If you experience trouble breathing, this can be serious. If it is severe call 911 IMMEDIATELY. If it is mild, please call our office. . If you take any of these medications: Glipizide/Metformin, Avandament, Glucavance, please do not take 48 hours after completing test unless otherwise instructed.   Once we have confirmed authorization from your insurance company, we will call you to set up a date and time for your test. Based on how quickly your insurance processes prior authorizations requests, please allow up to 4 weeks to be  contacted for scheduling your Cardiac CT appointment. Be advised that routine Cardiac CT appointments could be scheduled as many as 8 weeks after your provider has ordered it.  For non-scheduling related questions, please contact the cardiac imaging nurse navigator should you have any questions/concerns: Marchia Bond, Cardiac Imaging Nurse Navigator Gordy Clement, Cardiac Imaging Nurse Navigator [facility] Direct Office Dial: [phone]   For scheduling needs, including cancellations and rescheduling, please call [place], [phone].

## 2020-12-20 ENCOUNTER — Other Ambulatory Visit: Payer: Self-pay | Admitting: Primary Care

## 2020-12-20 DIAGNOSIS — R002 Palpitations: Secondary | ICD-10-CM

## 2020-12-28 ENCOUNTER — Ambulatory Visit (INDEPENDENT_AMBULATORY_CARE_PROVIDER_SITE_OTHER): Payer: Managed Care, Other (non HMO) | Admitting: Otolaryngology

## 2020-12-28 ENCOUNTER — Encounter (INDEPENDENT_AMBULATORY_CARE_PROVIDER_SITE_OTHER): Payer: Self-pay | Admitting: Otolaryngology

## 2020-12-28 ENCOUNTER — Other Ambulatory Visit: Payer: Self-pay

## 2020-12-28 VITALS — Temp 97.7°F

## 2020-12-28 DIAGNOSIS — J310 Chronic rhinitis: Secondary | ICD-10-CM

## 2020-12-28 DIAGNOSIS — J342 Deviated nasal septum: Secondary | ICD-10-CM | POA: Diagnosis not present

## 2020-12-28 DIAGNOSIS — H9313 Tinnitus, bilateral: Secondary | ICD-10-CM

## 2020-12-28 DIAGNOSIS — H903 Sensorineural hearing loss, bilateral: Secondary | ICD-10-CM

## 2020-12-28 NOTE — Progress Notes (Signed)
HPI: Bethany Sharp is a 75 y.o. female who presents is referred by her PCP for evaluation of multiple complaints including chronic "noise" in her ears and that her ears feel blocked.  She also complains of chronic postnasal drainage.  She apparently had Covid 2 months ago.  She uses Flonase since I saw daily.  She also complains of a intermittent burning sensation on her tongue.  She complains of a permanently stuffy nose despite use of Flonase regularly.  Right side is worse than the left side.  She complains letter ears feel clogged and she has a ringing in both ears.  She apparently has had this tinnitus for over 10 years.. Patient presents today with a good friend who does her translation.  Past Medical History:  Diagnosis Date  . Arthritis   . Depression   . Hyperlipidemia   . Thyroid disease    Past Surgical History:  Procedure Laterality Date  . APPENDECTOMY  1960   Social History   Socioeconomic History  . Marital status: Divorced    Spouse name: Not on file  . Number of children: Not on file  . Years of education: Not on file  . Highest education level: Not on file  Occupational History  . Not on file  Tobacco Use  . Smoking status: Current Every Day Smoker    Packs/day: 0.25    Years: 57.00    Pack years: 14.25    Start date: 1962  . Smokeless tobacco: Never Used  . Tobacco comment: smokes about 4 gigs a day  Substance and Sexual Activity  . Alcohol use: Yes  . Drug use: Not on file  . Sexual activity: Not on file  Other Topics Concern  . Not on file  Social History Narrative  . Not on file   Social Determinants of Health   Financial Resource Strain: Not on file  Food Insecurity: Not on file  Transportation Needs: Not on file  Physical Activity: Not on file  Stress: Not on file  Social Connections: Not on file   No family history on file. No Known Allergies Prior to Admission medications   Medication Sig Start Date End Date Taking? Authorizing Provider   atorvastatin (LIPITOR) 10 MG tablet Take 1 tablet (10 mg total) by mouth daily. For cholesterol. 12/12/20   [name], NP  fluticasone (FLONASE) 50 MCG/ACT nasal spray Place 1 spray into both nostrils 2 (two) times daily. 12/07/20   [name], NP  irbesartan (AVAPRO) 75 MG tablet Take 75 mg by mouth daily.    [provider]  levocetirizine (XYZAL) 5 MG tablet Take 1 tablet (5 mg total) by mouth every evening. For allergies. 12/07/20   [name], NP  levothyroxine (SYNTHROID) 50 MCG tablet Take 1 tablet by mouth every morning on an empty stomach with water only.  No food or other medications for 30 minutes. 05/27/19   [name], NP  metoprolol tartrate (LOPRESSOR) 50 MG tablet Take 2 hours prior to Cardiac CT 12/18/20   Rudean Haskell A, MD  sertraline (ZOLOFT) 50 MG tablet Take 1 tablet (50 mg total) by mouth daily. For anxiety 12/07/20   [name], NP     Positive ROS: Otherwise negative  All other systems have been reviewed and were otherwise negative with the exception of those mentioned in the HPI and as above.  Physical Exam: Constitutional: Alert, well-appearing, no acute distress Ears: External ears without lesions or tenderness. Ear canals are  clear bilaterally.  Both TMs are clear with no middle ear effusion noted.  On tuning fork testing AC is greater than BC bilaterally. Nasal: External nose without lesions. Septum is mildly deviated to the right with a narrowed right nasal passageway compared to the left..  On nasal endoscopy both middle meatus regions are clear with no mucopurulent discharge.  No polyps.  The nasopharynx is clear.  Eustachian tube areas are unobstructed. Oral: Lips and gums without lesions. Tongue and palate mucosa without lesions. Posterior oropharynx clear.  On examination of the tongue it is normal to appearance with no evidence of infection or yeast.  The tongue is soft to palpation.  Indirect laryngoscopy  revealed a clear hypopharynx and larynx. Neck: No palpable adenopathy or masses Respiratory: Breathing comfortably  Skin: No facial/neck lesions or rash noted.  Audiologic testing in the office today demonstrated a mild to moderate bilateral symmetric downsloping sensorineural hearing loss in both ears.  In the mid to low frequencies she is hearing about 30 dB.  Above 3000 frequency her hearing diminishes from 40 dB down to 70 dB..  Procedures  Assessment: Chronic rhinitis with septal deviation to the right Tinnitus secondary to high-frequency sensorineural hearing loss in both ears. Glossitis questionable etiology with no signs of infection.  Plan: Concerning the nasal obstruction discussed with her concerning regular use of Flonase which she is presently doing 2 sprays each nostril at night as well as use of saline irrigation. For the burning sensation on the tongue would recommend use of Biotene. For the tinnitus there is limited treatment for tinnitus.  I discussed with her concerning use of masking noise to help with tinnitus when things are quiet.  Also gave her samples of Lipo flavonoid to try as this is beneficial in some people with tinnitus.  She would also be a candidate for hearing aids that would help some with the hearing and fullness sensation.. If she does not get adequate nasal obstruction relief with use of Flonase could consider surgical intervention which would include septoplasty and turbinate reductions and briefly discussed this with the patient and her friend.  Who translates for her.   Radene Journey, MD   CC:

## 2021-01-01 ENCOUNTER — Other Ambulatory Visit: Payer: Managed Care, Other (non HMO)

## 2021-01-02 ENCOUNTER — Telehealth (HOSPITAL_COMMUNITY): Payer: Self-pay | Admitting: Emergency Medicine

## 2021-01-02 NOTE — Telephone Encounter (Signed)
Calling to review CCTA instructions however patient wishing to r/s until next week  Pt will call back when able to confirm availability  Marchia Bond RN [facility] [phone] Office  [phone] Cell

## 2021-01-04 ENCOUNTER — Ambulatory Visit (HOSPITAL_COMMUNITY): Admission: RE | Admit: 2021-01-04 | Payer: 59 | Source: Ambulatory Visit

## 2021-01-05 ENCOUNTER — Telehealth (HOSPITAL_COMMUNITY): Payer: Self-pay | Admitting: Emergency Medicine

## 2021-01-05 NOTE — Telephone Encounter (Signed)
Reaching out to patient to offer assistance regarding upcoming cardiac imaging study; pt verbalizes understanding of appt date/time, parking situation and where to check in, pre-test NPO status and medications ordered, and verified current allergies; name and call back number provided for further questions should they arise Bethany Bond RN Navigator Cardiac Imaging [facility] [phone] office [phone] cell  Hold allergy meds Speaks french - mattheiu is who I spoke with Clarise Cruz

## 2021-01-08 ENCOUNTER — Encounter (HOSPITAL_COMMUNITY): Payer: Self-pay

## 2021-01-08 ENCOUNTER — Other Ambulatory Visit: Payer: Self-pay

## 2021-01-08 ENCOUNTER — Ambulatory Visit (HOSPITAL_COMMUNITY)
Admission: RE | Admit: 2021-01-08 | Discharge: 2021-01-08 | Disposition: A | Discharge status: Home / Self Care | Payer: 59 | Source: Ambulatory Visit | Attending: Internal Medicine | Admitting: Internal Medicine

## 2021-01-08 DIAGNOSIS — R0600 Dyspnea, unspecified: Secondary | ICD-10-CM

## 2021-01-08 DIAGNOSIS — R0609 Other forms of dyspnea: Secondary | ICD-10-CM

## 2021-01-08 DIAGNOSIS — Z8249 Family history of ischemic heart disease and other diseases of the circulatory system: Secondary | ICD-10-CM | POA: Insufficient documentation

## 2021-01-08 DIAGNOSIS — R9430 Abnormal result of cardiovascular function study, unspecified: Secondary | ICD-10-CM | POA: Diagnosis not present

## 2021-01-08 DIAGNOSIS — I2510 Atherosclerotic heart disease of native coronary artery without angina pectoris: Secondary | ICD-10-CM | POA: Diagnosis not present

## 2021-01-08 LAB — POCT I-STAT CREATININE: Creatinine, Ser: 0.6 mg/dL (ref 0.44–1.00)

## 2021-01-08 MED ORDER — NITROGLYCERIN 0.4 MG SL SUBL
SUBLINGUAL_TABLET | SUBLINGUAL | Status: AC
  Administered 2021-01-08: 0.8 mg via SUBLINGUAL
  Filled 2021-01-08: qty 2

## 2021-01-08 MED ORDER — NITROGLYCERIN 0.4 MG SL SUBL: 0.8000 mg | SUBLINGUAL_TABLET | Freq: Once | SUBLINGUAL | Status: AC

## 2021-01-08 MED ORDER — IOHEXOL 350 MG/ML SOLN
80.0000 mL | Freq: Once | INTRAVENOUS | Status: AC | PRN
  Administered 2021-01-08: 80 mL via INTRAVENOUS

## 2021-01-09 DIAGNOSIS — I2510 Atherosclerotic heart disease of native coronary artery without angina pectoris: Secondary | ICD-10-CM | POA: Diagnosis not present

## 2021-01-09 DIAGNOSIS — R9430 Abnormal result of cardiovascular function study, unspecified: Secondary | ICD-10-CM | POA: Diagnosis not present

## 2021-01-16 DIAGNOSIS — R002 Palpitations: Secondary | ICD-10-CM | POA: Diagnosis not present

## 2021-01-18 ENCOUNTER — Other Ambulatory Visit: Payer: Self-pay

## 2021-01-18 ENCOUNTER — Encounter: Payer: Self-pay | Admitting: Internal Medicine

## 2021-01-18 ENCOUNTER — Ambulatory Visit: Payer: 59 | Admitting: Internal Medicine

## 2021-01-18 VITALS — BP 130/60 | HR 51 | Ht 64.0 in | Wt 151.0 lb

## 2021-01-18 DIAGNOSIS — Z8249 Family history of ischemic heart disease and other diseases of the circulatory system: Secondary | ICD-10-CM | POA: Diagnosis not present

## 2021-01-18 DIAGNOSIS — I471 Supraventricular tachycardia: Secondary | ICD-10-CM | POA: Diagnosis not present

## 2021-01-18 DIAGNOSIS — Z720 Tobacco use: Secondary | ICD-10-CM | POA: Diagnosis not present

## 2021-01-18 DIAGNOSIS — I25118 Atherosclerotic heart disease of native coronary artery with other forms of angina pectoris: Secondary | ICD-10-CM

## 2021-01-18 MED ORDER — ISOSORBIDE MONONITRATE ER 30 MG PO TB24: 15.0000 mg | ORAL_TABLET | Freq: Every day | ORAL | 2 refills | Status: DC

## 2021-01-18 MED ORDER — ASPIRIN EC 81 MG PO TBEC: 81.0000 mg | DELAYED_RELEASE_TABLET | Freq: Every day | ORAL | 3 refills | Status: DC

## 2021-01-18 NOTE — Patient Instructions (Signed)
Medication Instructions:  Your physician has recommended you make the following change in your medication:  START: Asprin 81 mg by mouth daily  START: Imdur 15 mg by mouth daily    *If you need a refill on your cardiac medications before your next appointment, please call your pharmacy*   Lab Work: NONE If you have labs (blood work) drawn today and your tests are completely normal, you will receive your results only by: Marland Kitchen MyChart Message (if you have MyChart) OR . A paper copy in the mail If you have any lab test that is abnormal or we need to change your treatment, we will call you to review the results.   Testing/Procedures: NONE   Follow-Up: At [facility], you and your health needs are our priority.  As part of our continuing mission to provide you with exceptional heart care, we have created designated Provider Care Teams.  These Care Teams include your primary Cardiologist (physician) and Advanced Practice Providers (APPs -  Physician Assistants and Nurse Practitioners) who all work together to provide you with the care you need, when you need it.  We recommend signing up for the patient portal called "MyChart".  Sign up information is provided on this After Visit Summary.  MyChart is used to connect with patients for Virtual Visits (Telemedicine).  Patients are able to view lab/test results, encounter notes, upcoming appointments, etc.  Non-urgent messages can be sent to your provider as well.   To learn more about what you can do with MyChart, go to [email or web address].    Your next appointment:   2-3 month(s)  The format for your next appointment:   In Person  Provider:   You may see Rudean Haskell, MD or one of the following Advanced Practice Providers on your designated Care Team:    Melina Copa, PA-C  Ermalinda Barrios, PA-C

## 2021-01-18 NOTE — Progress Notes (Signed)
Cardiology Office Note:    Date:  01/18/2021   ID:  Bethany Sharp, DOB 10-05-1946, MRN [id number]  PCP:  [name], NP   [place]  Cardiologist:  Rudean Haskell MD Advanced Practice Provider:  No care team member to display Electrophysiologist:  None      CC: follow up positive CCTA  Interpretor Service Offered; deferred for son  History of Present Illness:    Bethany Sharp is a 75 y.o. female with a hx of HLD, tobacco abuse, and family history of aortic dissection who presents for evaluation 12/18/20. In interim of this visit, patient significant SVT and positive CCTA.  Patient notes that she is doing well.  Since last visit notes only one minor episodes of CP.  Relevant interval testing or therapy include now further palpitations despite SVT.  There are no interval hospital/ED visit.    One episode of CP when bending over.  No SOB/DOE and no PND/Orthopnea.  No weight gain or leg swelling.  No palpitations since the second week of the heart monitor.   Past Medical History:  Diagnosis Date  . Arthritis   . Depression   . Hyperlipidemia   . Thyroid disease     Past Surgical History:  Procedure Laterality Date  . APPENDECTOMY  1960    Current Medications: Current Meds  Medication Sig  . aspirin EC 81 MG tablet Take 1 tablet (81 mg total) by mouth daily. Swallow whole.  Marland Kitchen atorvastatin (LIPITOR) 10 MG tablet Take 1 tablet (10 mg total) by mouth daily. For cholesterol.  . fluticasone (FLONASE) 50 MCG/ACT nasal spray Place 1 spray into both nostrils 2 (two) times daily.  . irbesartan (AVAPRO) 75 MG tablet Take 75 mg by mouth daily.  . isosorbide mononitrate (IMDUR) 30 MG 24 hr tablet Take 0.5 tablets (15 mg total) by mouth daily.  Marland Kitchen levocetirizine (XYZAL) 5 MG tablet Take 1 tablet (5 mg total) by mouth every evening. For allergies.  Marland Kitchen levothyroxine (SYNTHROID) 50 MCG tablet Take 1 tablet by mouth every morning on an empty stomach with  water only.  No food or other medications for 30 minutes.  . metoprolol tartrate (LOPRESSOR) 50 MG tablet Take 2 hours prior to Cardiac CT  . sertraline (ZOLOFT) 50 MG tablet Take 1 tablet (50 mg total) by mouth daily. For anxiety     Allergies:   Patient has no known allergies.   Social History   Socioeconomic History  . Marital status: Divorced    Spouse name: Not on file  . Number of children: Not on file  . Years of education: Not on file  . Highest education level: Not on file  Occupational History  . Not on file  Tobacco Use  . Smoking status: Current Every Day Smoker    Packs/day: 0.25    Years: 57.00    Pack years: 14.25    Start date: 1962  . Smokeless tobacco: Never Used  . Tobacco comment: smokes about 4 gigs a day  Substance and Sexual Activity  . Alcohol use: Yes  . Drug use: Not on file  . Sexual activity: Not on file  Other Topics Concern  . Not on file  Social History Narrative  . Not on file   Social Determinants of Health   Financial Resource Strain: Not on file  Food Insecurity: Not on file  Transportation Needs: Not on file  Physical Activity: Not on file  Stress: Not on file  Social Connections:  Not on file    Social: patient presented with son, [name].  Family History: Grandfather died at age 38 from PE Grandmother died at age 62 from an aortic dissection. Sister died from colon cancer.  ROS:   Please see the history of present illness.     All other systems reviewed and are negative.  EKGs/Labs/Other Studies Reviewed:    The following studies were reviewed today:  EKG:   12/07/20: SR 64  Cardiac Event Monitoring: Date: 01/16/21 Results:  Patient had a minimum heart rate of 46 bpm, maximum heart rate of 207 bpm, and average heart rate of 68 bpm.  Predominant underlying rhythm was sinus rhythm.  One run of non-sustained ventricular tachycardia occurred lasting 8 beats at longest with a max rate of 207 bpm at fastest.  Multiple  (~ 380) runs of supraventricular tachycardia occurred lasting 35 seconds at longest with a max rate of 95 bpm at fastest.  Isolated PACs were occasional (1.3%), with rare couplets and triplets present.  Isolated PVCs were rare (<1.0%), with rare couplets.  Triggered and diary events associated with NSVT and PVCs.   Has many short runs of asymptomatic SVT with symptomatic PVCs and NSVT.  Cardiac CT: Date:01/18/2021 Results: 1. Coronary calcium score of 74. This was 90 percentile for age and sex matched control.  2. Normal coronary origin with right dominance.  3. Mid LAD calcified plaque at bifurcation with diagonal. Possible severe ostial disease of diagonal branch (stair step artifact markedly reduces quality of study). Will attempt FFR analysis. If symptoms worsen or become more worrisome, recommend further cardiac testing.  IMPRESSION: No significant incidental findings. Calcified granuloma along the right posteromedial lung base near the diaphragm.   Recent Labs: 12/07/2020: ALT 13; BUN 11; Hemoglobin 13.8; Platelets 273.0; Potassium 5.0; Sodium 139; TSH 0.80 01/08/2021: Creatinine, Ser 0.60  Recent Lipid Panel    Component Value Date/Time   CHOL 242 (H) 12/07/2020 1620   TRIG 286.0 (H) 12/07/2020 1620   HDL 77.20 12/07/2020 1620   CHOLHDL 3 12/07/2020 1620   VLDL 57.2 (H) 12/07/2020 1620   LDLCALC 152 (H) 05/26/2019 1203   LDLDIRECT 132.0 12/07/2020 1620    Risk Assessment/Calculations:     N/A  Physical Exam:    VS:  BP 130/60   Pulse (!) 51   Ht 5\' 4"  (1.626 m)   Wt 151 lb (68.5 kg)   SpO2 97%   BMI 25.92 kg/m     Wt Readings from Last 3 Encounters:  01/18/21 151 lb (68.5 kg)  12/18/20 151 lb (68.5 kg)  12/07/20 153 lb (69.4 kg)   GEN:  Well nourished, well developed in no acute distress HEENT: Normal NECK: No JVD; No carotid bruits LYMPHATICS: No lymphadenopathy CARDIAC: RRR, no murmurs, rubs, gallops RESPIRATORY:  Clear to auscultation  without rales, wheezing or rhonchi  ABDOMEN: Soft, non-tender, non-distended MUSCULOSKELETAL:  No edema; No deformity  SKIN: Warm and dry NEUROLOGIC:  Alert and oriented x 3 PSYCHIATRIC:  Normal affect   ASSESSMENT:    1. Coronary artery disease of native artery of native heart with stable angina pectoris ([place])   2. SVT (supraventricular tachycardia) ([place])   3. Tobacco abuse   4. Family history of aortic dissection    PLAN:    In order of problems listed above:  Coronary Artery Disease; Possibly obstructive SVT- presently asymptomatic Family history of AA Tobacco Abuse- 1 cigarette a day - asymptomatic  - anatomy: mLAD and D1 disease per CCTA - Start  ASA 81 mg - continue statin, goal LDL < 70 - starting 15 mg PO Imdur for CP Discussed at length with patient and son:  Will attempt medical therapy; if unable to control CP will proceed to [facility].  Risks and benefits of cardiac catheterization have been discussed with the patient.  These include bleeding, infection, kidney damage, stroke, heart attack, death.  The patient understands these risks and is willing to proceed if worsening sx - deferring BB for SVT given lack of Sx and low resting HR; low threshold to get echocardiogram - discussed smoking cessation - No AA or dilation  2-3 months follow up unless new symptoms or abnormal test results warranting change in plan Would be reasonable for  APP Follow up  Time Spent Directly with Patient:   I have spent a total of 30 minutes with the patient reviewing notes, imaging, EKGs, labs and examining the patient as well as establishing an assessment and plan that was discussed personally with the patient.  > 50% of time was spent in direct patient care and son and reviewing imaging with patient .   Medication Adjustments/Labs and Tests Ordered: Current medicines are reviewed at length with the patient today.  Concerns regarding medicines are outlined above.  Orders Placed This  Encounter  Procedures  . EKG 12-Lead   Meds ordered this encounter  Medications  . aspirin EC 81 MG tablet    Sig: Take 1 tablet (81 mg total) by mouth daily. Swallow whole.    Dispense:  90 tablet    Refill:  3  . isosorbide mononitrate (IMDUR) 30 MG 24 hr tablet    Sig: Take 0.5 tablets (15 mg total) by mouth daily.    Dispense:  30 tablet    Refill:  2    Patient Instructions  Medication Instructions:  Your physician has recommended you make the following change in your medication:  START: Asprin 81 mg by mouth daily  START: Imdur 15 mg by mouth daily    *If you need a refill on your cardiac medications before your next appointment, please call your pharmacy*   Lab Work: NONE If you have labs (blood work) drawn today and your tests are completely normal, you will receive your results only by: Marland Kitchen MyChart Message (if you have MyChart) OR . A paper copy in the mail If you have any lab test that is abnormal or we need to change your treatment, we will call you to review the results.   Testing/Procedures: NONE   Follow-Up: At [facility], you and your health needs are our priority.  As part of our continuing mission to provide you with exceptional heart care, we have created designated Provider Care Teams.  These Care Teams include your primary Cardiologist (physician) and Advanced Practice Providers (APPs -  Physician Assistants and Nurse Practitioners) who all work together to provide you with the care you need, when you need it.  We recommend signing up for the patient portal called "MyChart".  Sign up information is provided on this After Visit Summary.  MyChart is used to connect with patients for Virtual Visits (Telemedicine).  Patients are able to view lab/test results, encounter notes, upcoming appointments, etc.  Non-urgent messages can be sent to your provider as well.   To learn more about what you can do with MyChart, go to [email or web address].    Your  next appointment:   2-3 month(s)  The format for your next appointment:   In Person  Provider:  You may see Rudean Haskell, MD or one of the following Advanced Practice Providers on your designated Care Team:    Melina Copa, PA-C  Ermalinda Barrios, PA-C          Signed, Werner Lean, MD  01/18/2021 1:06 PM    [place]

## 2021-02-13 ENCOUNTER — Encounter (INDEPENDENT_AMBULATORY_CARE_PROVIDER_SITE_OTHER): Payer: Self-pay

## 2021-03-06 ENCOUNTER — Other Ambulatory Visit: Payer: Self-pay | Admitting: Primary Care

## 2021-03-06 DIAGNOSIS — F411 Generalized anxiety disorder: Secondary | ICD-10-CM

## 2021-03-06 DIAGNOSIS — R0982 Postnasal drip: Secondary | ICD-10-CM

## 2021-03-06 NOTE — Telephone Encounter (Signed)
Refill request received for sertraline (Zoloft).  It looks like I prescribed her this in late February 2022, I also asked for her to follow-up.  Is she taking the sertraline?  Is it helping with anxiety? If it is helping, then okay to send refill as pended.  If it is not helping then I recommend she follow-up with me for further evaluation.

## 2021-03-13 NOTE — Telephone Encounter (Signed)
Pt son called in and scheduled an appt on 6/16 @3  and wanted to know about getting the sertraline refilled and he stated it is helping

## 2021-03-29 ENCOUNTER — Ambulatory Visit (INDEPENDENT_AMBULATORY_CARE_PROVIDER_SITE_OTHER): Payer: 59 | Admitting: Primary Care

## 2021-03-29 ENCOUNTER — Other Ambulatory Visit: Payer: Self-pay

## 2021-03-29 ENCOUNTER — Encounter: Payer: Self-pay | Admitting: Primary Care

## 2021-03-29 DIAGNOSIS — R911 Solitary pulmonary nodule: Secondary | ICD-10-CM | POA: Diagnosis not present

## 2021-03-29 DIAGNOSIS — I10 Essential (primary) hypertension: Secondary | ICD-10-CM | POA: Diagnosis not present

## 2021-03-29 DIAGNOSIS — H938X3 Other specified disorders of ear, bilateral: Secondary | ICD-10-CM

## 2021-03-29 DIAGNOSIS — Z800 Family history of malignant neoplasm of digestive organs: Secondary | ICD-10-CM | POA: Diagnosis not present

## 2021-03-29 DIAGNOSIS — R69 Illness, unspecified: Secondary | ICD-10-CM | POA: Diagnosis not present

## 2021-03-29 DIAGNOSIS — F411 Generalized anxiety disorder: Secondary | ICD-10-CM

## 2021-03-29 DIAGNOSIS — I25118 Atherosclerotic heart disease of native coronary artery with other forms of angina pectoris: Secondary | ICD-10-CM | POA: Diagnosis not present

## 2021-03-29 DIAGNOSIS — E785 Hyperlipidemia, unspecified: Secondary | ICD-10-CM | POA: Diagnosis not present

## 2021-03-29 NOTE — Assessment & Plan Note (Signed)
Recent coronary CT reviewed.  Continue Imdur 15 milligrams for now, recommended she continue aspirin 81 mg as well.  She is considering catheterization with stenting, follow-up with cardiology.

## 2021-03-29 NOTE — Assessment & Plan Note (Signed)
Evaluated by ENT, no found cause for symptoms.  She will be seeing ENT in [place] for second opinion.

## 2021-03-29 NOTE — Assessment & Plan Note (Signed)
She is pending colonoscopy, wants to find out cost before initiating referral.  She will update.

## 2021-03-29 NOTE — Assessment & Plan Note (Signed)
No significant difference on Zoloft 50 mg, wants to wean off.  Discussed instructions for weaning, she will update if she would like to resume.  Consider switching to Lexapro or even increasing the dose of Zoloft to 100 mg.  She will update

## 2021-03-29 NOTE — Assessment & Plan Note (Addendum)
Compliant to atorvastatin 10 mg, continue same. Forgot to have patient repeat lipid panel and labs today, will have this collected at her upcoming cardiology visit.

## 2021-03-29 NOTE — Assessment & Plan Note (Signed)
Well-controlled in the office today, continue irbesartan 75 mg daily.

## 2021-03-29 NOTE — Patient Instructions (Signed)
You must wean off of the sertraline (anxiety medication) slowly. start by taking 1/2 tablet once daily for 2 weeks, then 1/2 tablet every other day for one week, then stop.  Call the insurance company to find out the price of the colonoscopy.  We will see you in September! Safe travels to [place]!   Vous devez vous sevrer lentement de la sertraline (anxiolytique). commencer par prendre 1/2 comprim une fois par jour pendant 2 semaines, puis 1/2 comprim tous les deux jours pendant une semaine, puis arrter.  Appelez la compagnie d'assurance pour [employer] prix de la coloscopie.  Nous vous verrons en septembre ! Bon voyage en [place] !

## 2021-03-29 NOTE — Progress Notes (Signed)
Subjective:    Patient ID: Bethany Sharp, female    DOB: Nov 15, 1945, 75 y.o.   MRN: [id number]  HPI  Kinlie Janice is a very pleasant 75 y.o. female with a history of hypertension, CAD, SVT, hypothyroidism, tobacco abuse, lung nodule who presents today for follow up.  Her son is with her who is translating as she speaks [place] mostly.  Following with cardiology, managed on aspirin 81 mg and Imdur 15 mg which was initiated in April 2022 per cardiology for chest pain. She underwent coronary CT in early June 2022, significant stenosis to mid LAD. Plan during this visit was to treat CAD medically and if no improvement then proceed with cardiac catheterization and stenting.  She is wanting to come off of the Imdur, will speak with cardiologist about this.  She is planning on completing the colonoscopy, is wanting to find out price first.  She would like to discontinue her Zoloft 50 mg as she doesn't notice a difference. She does have some anxiety, is not bothered by her anxiety at this time.  She is planning a trip to [place] in August and will be gone for 1 month, she may get her colonoscopy there if it is more cost effective.  She does plan on getting a second opinion from ENT while in [place] as there has been no found cause for her symptoms.  She is under evaluation for chronic lung nodule, recent imaging reveals no change in nodule.  BP Readings from Last 3 Encounters:  03/29/21 128/74  01/18/21 130/60  01/08/21 (!) 107/59      Review of Systems  HENT:         Ear fullness, chronic  Respiratory:  Negative for shortness of breath.   Cardiovascular:  Positive for palpitations. Negative for chest pain.  Neurological:  Negative for dizziness.  Psychiatric/Behavioral:  The patient is nervous/anxious.         Past Medical History:  Diagnosis Date   Arthritis    Depression    Hyperlipidemia    Thyroid disease     Social History   Socioeconomic History   Marital status:  Divorced    Spouse name: Not on file   Number of children: Not on file   Years of education: Not on file   Highest education level: Not on file  Occupational History   Not on file  Tobacco Use   Smoking status: Every Day    Packs/day: 0.25    Years: 57.00    Pack years: 14.25    Types: Cigarettes    Start date: 1962   Smokeless tobacco: Never   Tobacco comments:    smokes about 4 gigs a day  Substance and Sexual Activity   Alcohol use: Yes   Drug use: Not on file   Sexual activity: Not on file  Other Topics Concern   Not on file  Social History Narrative   Not on file   Social Determinants of Health   Financial Resource Strain: Not on file  Food Insecurity: Not on file  Transportation Needs: Not on file  Physical Activity: Not on file  Stress: Not on file  Social Connections: Not on file  Intimate Partner Violence: Not on file    Past Surgical History:  Procedure Laterality Date   APPENDECTOMY  1960    History reviewed. No pertinent family history.  No Known Allergies  Current Outpatient Medications on File Prior to Visit  Medication Sig Dispense Refill   aspirin EC  81 MG tablet Take 1 tablet (81 mg total) by mouth daily. Swallow whole. 90 tablet 3   atorvastatin (LIPITOR) 10 MG tablet Take 1 tablet (10 mg total) by mouth daily. For cholesterol. 90 tablet 3   fluticasone (FLONASE) 50 MCG/ACT nasal spray Place 1 spray into both nostrils 2 (two) times daily. 16 g 0   irbesartan (AVAPRO) 75 MG tablet Take 75 mg by mouth daily.     isosorbide mononitrate (IMDUR) 30 MG 24 hr tablet Take 0.5 tablets (15 mg total) by mouth daily. 30 tablet 2   levocetirizine (XYZAL) 5 MG tablet TAKE 1 TABLET BY MOUTH EVERY DAY IN THE EVENING FOR ALLERGIES 90 tablet 0   levothyroxine (SYNTHROID) 50 MCG tablet Take 1 tablet by mouth every morning on an empty stomach with water only.  No food or other medications for 30 minutes. 90 tablet 3   No current facility-administered medications  on file prior to visit.    BP 128/74   Pulse 64   Temp 97.7 F (36.5 C) (Temporal)   Ht 5\' 4"  (1.626 m)   Wt 154 lb (69.9 kg)   SpO2 95%   BMI 26.43 kg/m  Objective:   Physical Exam HENT:     Ears:     Comments: Mild bulging to bilateral TMs, otherwise unremarkable. Cardiovascular:     Rate and Rhythm: Normal rate and regular rhythm.  Pulmonary:     Effort: Pulmonary effort is normal.     Breath sounds: Normal breath sounds.  Musculoskeletal:     Cervical back: Neck supple.  Skin:    General: Skin is warm and dry.  Neurological:     Mental Status: She is alert.          Assessment & Plan:      This visit occurred during the SARS-CoV-2 public health emergency.  Safety protocols were in place, including screening questions prior to the visit, additional usage of staff PPE, and extensive cleaning of exam room while observing appropriate contact time as indicated for disinfecting solutions.

## 2021-03-29 NOTE — Assessment & Plan Note (Signed)
Stable per cardiology with recent CT coronary scan.

## 2021-04-10 NOTE — Telephone Encounter (Signed)
Bethany Sharp, is there any way to add back the exact Zoloft prescription that I discontinued in early June?  The prescription that shows #90 with 2 refills?  Or do we have to manually added back to her medication list?

## 2021-04-12 ENCOUNTER — Ambulatory Visit: Payer: 59 | Admitting: Internal Medicine

## 2021-04-12 NOTE — Telephone Encounter (Signed)
Noted, Zoloft 50 mg added to list.

## 2021-05-04 NOTE — Progress Notes (Signed)
Cardiology Office Note:    Date:  05/07/2021   ID:  Bethany Sharp, DOB 1945-10-27, MRN [id number]  PCP:  [name], NP   [facility]  Cardiologist:  Rudean Haskell MD Advanced Practice Provider:  No care team member to display Electrophysiologist:  None      CC: CAD f/u  Interpretor Service Offered; deferred for son (again)  History of Present Illness:    Bethany Sharp is a 75 y.o. female with a hx of HLD, tobacco abuse, and family history of aortic dissection who presents for evaluation 12/18/20. In interim of this visit, patient significant SVT and positive CCTA. Wanted to attempt medical mgmt.  Seen 05/07/21.  Patient notes that she is doing great- one medical management feels less after working out.  Since last visit notes  changes slight headache on the med.  Relevant interval testing or therapy include .  There are no interval hospital/ED visit.    No chest pain or pressure.  DOE has improved considerably on Imdur, still has some going up steps and no PND/Orthopnea.  No weight gain or leg swelling.  No palpitations or syncope.  Does not bilateral leg cramps w activity, not worse with walking up hill.   Past Medical History:  Diagnosis Date   Arthritis    Depression    Hyperlipidemia    Thyroid disease     Past Surgical History:  Procedure Laterality Date   APPENDECTOMY  1960    Current Medications: Current Meds  Medication Sig   aspirin EC 81 MG tablet Take 1 tablet (81 mg total) by mouth daily. Swallow whole.   atorvastatin (LIPITOR) 10 MG tablet Take 1 tablet (10 mg total) by mouth daily. For cholesterol.   fluticasone (FLONASE) 50 MCG/ACT nasal spray Place 1 spray into both nostrils 2 (two) times daily.   irbesartan (AVAPRO) 75 MG tablet Take 75 mg by mouth daily.   levocetirizine (XYZAL) 5 MG tablet TAKE 1 TABLET BY MOUTH EVERY DAY IN THE EVENING FOR ALLERGIES   levothyroxine (SYNTHROID) 50 MCG tablet Take 1 tablet by mouth  every morning on an empty stomach with water only.  No food or other medications for 30 minutes.   sertraline (ZOLOFT) 50 MG tablet Take 50 mg by mouth daily.   [DISCONTINUED] isosorbide mononitrate (IMDUR) 30 MG 24 hr tablet Take 0.5 tablets (15 mg total) by mouth daily.     Allergies:   Patient has no known allergies.   Social History   Socioeconomic History   Marital status: Divorced    Spouse name: Not on file   Number of children: Not on file   Years of education: Not on file   Highest education level: Not on file  Occupational History   Not on file  Tobacco Use   Smoking status: Every Day    Packs/day: 0.25    Years: 57.00    Pack years: 14.25    Types: Cigarettes    Start date: 1962   Smokeless tobacco: Never   Tobacco comments:    smokes about 4 gigs a day  Substance and Sexual Activity   Alcohol use: Yes   Drug use: Not on file   Sexual activity: Not on file  Other Topics Concern   Not on file  Social History Narrative   Not on file   Social Determinants of Health   Financial Resource Strain: Not on file  Food Insecurity: Not on file  Transportation Needs: Not on file  Physical Activity: Not on file  Stress: Not on file  Social Connections: Not on file    Social: patient presents with son, [name], going back to [place] for one month  Family History: Grandfather died at age 12 from PE Grandmother died at age 68 from an aortic dissection. Sister died from colon cancer.  ROS:   Please see the history of present illness.     All other systems reviewed and are negative.  EKGs/Labs/Other Studies Reviewed:    The following studies were reviewed today:  EKG:   12/07/20: SR 64  Cardiac Event Monitoring: Date: 01/16/21 Results: Patient had a minimum heart rate of 46 bpm, maximum heart rate of 207 bpm, and average heart rate of 68 bpm. Predominant underlying rhythm was sinus rhythm. One run of non-sustained ventricular tachycardia occurred lasting 8  beats at longest with a max rate of 207 bpm at fastest. Multiple (~ 380) runs of supraventricular tachycardia occurred lasting 35 seconds at longest with a max rate of 95 bpm at fastest. Isolated PACs were occasional (1.3%), with rare couplets and triplets present. Isolated PVCs were rare (<1.0%), with rare couplets. Triggered and diary events associated with NSVT and PVCs.   Has many short runs of asymptomatic SVT with symptomatic PVCs and NSVT.  Cardiac CT: Date:01/18/2021 Results: 1. Coronary calcium score of 74. This was 1 percentile for age and sex matched control.   2. Normal coronary origin with right dominance.   3. Mid LAD calcified plaque at bifurcation with diagonal. Possible severe ostial disease of diagonal branch (stair step artifact markedly reduces quality of study). Will attempt FFR analysis. If symptoms worsen or become more worrisome, recommend further cardiac testing.   IMPRESSION: No significant incidental findings. Calcified granuloma along the right posteromedial lung base near the diaphragm.    Recent Labs: 12/07/2020: ALT 13; BUN 11; Hemoglobin 13.8; Platelets 273.0; Potassium 5.0; Sodium 139; TSH 0.80 01/08/2021: Creatinine, Ser 0.60  Recent Lipid Panel    Component Value Date/Time   CHOL 242 (H) 12/07/2020 1620   TRIG 286.0 (H) 12/07/2020 1620   HDL 77.20 12/07/2020 1620   CHOLHDL 3 12/07/2020 1620   VLDL 57.2 (H) 12/07/2020 1620   LDLCALC 152 (H) 05/26/2019 1203   LDLDIRECT 132.0 12/07/2020 1620    Risk Assessment/Calculations:     N/A  Physical Exam:    VS:  BP 124/78 (BP Location: Left Arm, Patient Position: Sitting, Cuff Size: Normal)   Pulse 73   Ht '5\' 6"'$  (1.676 m)   Wt 154 lb 9.6 oz (70.1 kg)   SpO2 99%   BMI 24.95 kg/m     Wt Readings from Last 3 Encounters:  05/07/21 154 lb 9.6 oz (70.1 kg)  03/29/21 154 lb (69.9 kg)  01/18/21 151 lb (68.5 kg)   GEN:  Well nourished, well developed in no acute distress HEENT: Normal NECK:  No JVD LYMPHATICS: No lymphadenopathy CARDIAC: RRR, no murmurs, rubs, gallops, +2 DP Pulses RESPIRATORY:  Clear to auscultation without rales, wheezing or rhonchi  ABDOMEN: Soft, non-tender, non-distended MUSCULOSKELETAL:  No edema; No deformity  SKIN: Warm and dry NEUROLOGIC:  Alert and oriented x 3 PSYCHIATRIC:  Normal affect   ASSESSMENT:    1. Coronary artery disease of native artery of native heart with stable angina pectoris ([place])   2. Essential hypertension   3. SVT (supraventricular tachycardia) ([place])   4. Tobacco abuse   5. Family history of aortic dissection   6. Hyperlipidemia, unspecified hyperlipidemia type  PLAN:    In order of problems listed above:  Coronary Artery Disease; Possibly obstructive SVT- presently asymptomatic Family history of AA Tobacco Abuse- 1 cigarette a day - asymptomatic  - anatomy: mLAD and D1 disease per CCTA - continue ASA 81 mg - continue atorvastatin 10 mg, goal LDL < 70 lipids for today - Continue 15 mg PO Imdur for CP - deferring BB for SVT given lack of Sx and low resting HR - discussed smoking cessation again - No AA or dilation - discussed the limitations of asymptomatic CA duplex and when to intervene - atypical claudication like pain; this may be related to language barrier, if this is still an issue in 2023 we will scan her legs - if worsening DOE or CP or if unable to tolerate Imdur we have discussed LHC   NSAID questions - Significant use of NSAIDs is associated with increase in cardiovascular disease - we recommend the lowest dose for the least amount of time, and recommended cessation if feasbile   Jan-Mar 2023 follow up unless new symptoms or abnormal test results warranting change in plan  Would be reasonable for  Video Visit Follow up Would be reasonable for  APP Follow up    Medication Adjustments/Labs and Tests Ordered: Current medicines are reviewed at length with the patient today.  Concerns regarding  medicines are outlined above.  Orders Placed This Encounter  Procedures   Lipid panel    Meds ordered this encounter  Medications   isosorbide mononitrate (IMDUR) 30 MG 24 hr tablet    Sig: Take 0.5 tablets (15 mg total) by mouth daily.    Dispense:  45 tablet    Refill:  3     Patient Instructions  Medication Instructions:  Your physician recommends that you continue on your current medications as directed. Please refer to the Current Medication list given to you today.  Imdur 15 mg was refilled today  *If you need a refill on your cardiac medications before your next appointment, please call your pharmacy*   Lab Work: TODAY: Lipid Panel If you have labs (blood work) drawn today and your tests are completely normal, you will receive your results only by: [place] (if you have MyChart) OR A paper copy in the mail If you have any lab test that is abnormal or we need to change your treatment, we will call you to review the results.   Testing/Procedures: NONE   Follow-Up: At [facility], you and your health needs are our priority.  As part of our continuing mission to provide you with exceptional heart care, we have created designated Provider Care Teams.  These Care Teams include your primary Cardiologist (physician) and Advanced Practice Providers (APPs -  Physician Assistants and Nurse Practitioners) who all work together to provide you with the care you need, when you need it.   Your next appointment:   6 -46months)  The format for your next appointment:   In Person  Provider:   You may see MRudean Haskell MD or one of the following Advanced Practice Providers on your designated Care Team:   DMelina Copa PA-C MErmalinda Barrios PA-C      Signed, MWerner Lean MD  05/07/2021 9:54 AM    [place]

## 2021-05-06 DIAGNOSIS — Z20822 Contact with and (suspected) exposure to covid-19: Secondary | ICD-10-CM | POA: Diagnosis not present

## 2021-05-07 ENCOUNTER — Ambulatory Visit: Payer: 59 | Admitting: Internal Medicine

## 2021-05-07 ENCOUNTER — Encounter: Payer: Self-pay | Admitting: Internal Medicine

## 2021-05-07 ENCOUNTER — Other Ambulatory Visit: Payer: Self-pay

## 2021-05-07 VITALS — BP 124/78 | HR 73 | Ht 66.0 in | Wt 154.6 lb

## 2021-05-07 DIAGNOSIS — E785 Hyperlipidemia, unspecified: Secondary | ICD-10-CM | POA: Diagnosis not present

## 2021-05-07 DIAGNOSIS — I10 Essential (primary) hypertension: Secondary | ICD-10-CM | POA: Diagnosis not present

## 2021-05-07 DIAGNOSIS — I471 Supraventricular tachycardia, unspecified: Secondary | ICD-10-CM

## 2021-05-07 DIAGNOSIS — Z20822 Contact with and (suspected) exposure to covid-19: Secondary | ICD-10-CM | POA: Diagnosis not present

## 2021-05-07 DIAGNOSIS — Z8249 Family history of ischemic heart disease and other diseases of the circulatory system: Secondary | ICD-10-CM

## 2021-05-07 DIAGNOSIS — I25118 Atherosclerotic heart disease of native coronary artery with other forms of angina pectoris: Secondary | ICD-10-CM | POA: Diagnosis not present

## 2021-05-07 DIAGNOSIS — Z720 Tobacco use: Secondary | ICD-10-CM

## 2021-05-07 LAB — LIPID PANEL
Chol/HDL Ratio: 2 ratio (ref 0.0–4.4)
Cholesterol, Total: 189 mg/dL (ref 100–199)
HDL: 93 mg/dL (ref >39)
LDL Chol Calc (NIH): 77 mg/dL (ref 0–99)
Triglycerides: 108 mg/dL (ref 0–149)
VLDL Cholesterol Cal: 19 mg/dL (ref 5–40)

## 2021-05-07 MED ORDER — ISOSORBIDE MONONITRATE ER 30 MG PO TB24: 15.0000 mg | ORAL_TABLET | Freq: Every day | ORAL | 3 refills | Status: DC

## 2021-05-07 NOTE — Patient Instructions (Signed)
Medication Instructions:  Your physician recommends that you continue on your current medications as directed. Please refer to the Current Medication list given to you today.  Imdur 15 mg was refilled today  *If you need a refill on your cardiac medications before your next appointment, please call your pharmacy*   Lab Work: TODAY: Lipid Panel If you have labs (blood work) drawn today and your tests are completely normal, you will receive your results only by: [place] (if you have MyChart) OR A paper copy in the mail If you have any lab test that is abnormal or we need to change your treatment, we will call you to review the results.   Testing/Procedures: NONE   Follow-Up: At [facility], you and your health needs are our priority.  As part of our continuing mission to provide you with exceptional heart care, we have created designated Provider Care Teams.  These Care Teams include your primary Cardiologist (physician) and Advanced Practice Providers (APPs -  Physician Assistants and Nurse Practitioners) who all work together to provide you with the care you need, when you need it.   Your next appointment:   6 -36months)  The format for your next appointment:   In Person  Provider:   You may see MRudean Haskell MD or one of the following Advanced Practice Providers on your designated Care Team:   DMelina Copa PA-C MErmalinda Barrios PA-C

## 2021-05-09 ENCOUNTER — Telehealth: Payer: Self-pay

## 2021-05-09 MED ORDER — ATORVASTATIN CALCIUM 20 MG PO TABS: 20.0000 mg | ORAL_TABLET | Freq: Every day | ORAL | 3 refills | Status: DC

## 2021-05-09 NOTE — Telephone Encounter (Signed)
Order placed for atorvastatin 20 mg PO QD.

## 2021-05-09 NOTE — Telephone Encounter (Signed)
-----   Message from Werner Lean, MD sent at 05/08/2021  4:58 PM EDT ----- Results: LDL above goal Plan: When back from [place], increase atorvastatin to 20 mg PO Daily, lipids in three months  Werner Lean, MD

## 2021-06-03 ENCOUNTER — Other Ambulatory Visit: Payer: Self-pay | Admitting: Primary Care

## 2021-06-03 DIAGNOSIS — R0982 Postnasal drip: Secondary | ICD-10-CM

## 2021-07-05 ENCOUNTER — Ambulatory Visit (INDEPENDENT_AMBULATORY_CARE_PROVIDER_SITE_OTHER): Payer: 59 | Admitting: Primary Care

## 2021-07-05 ENCOUNTER — Other Ambulatory Visit: Payer: Self-pay

## 2021-07-05 VITALS — BP 126/72 | Temp 97.8°F | Ht 64.0 in | Wt 155.0 lb

## 2021-07-05 DIAGNOSIS — E039 Hypothyroidism, unspecified: Secondary | ICD-10-CM | POA: Diagnosis not present

## 2021-07-05 DIAGNOSIS — G4730 Sleep apnea, unspecified: Secondary | ICD-10-CM | POA: Diagnosis not present

## 2021-07-05 DIAGNOSIS — I10 Essential (primary) hypertension: Secondary | ICD-10-CM | POA: Diagnosis not present

## 2021-07-05 DIAGNOSIS — R69 Illness, unspecified: Secondary | ICD-10-CM | POA: Diagnosis not present

## 2021-07-05 DIAGNOSIS — I25118 Atherosclerotic heart disease of native coronary artery with other forms of angina pectoris: Secondary | ICD-10-CM | POA: Diagnosis not present

## 2021-07-05 DIAGNOSIS — Z800 Family history of malignant neoplasm of digestive organs: Secondary | ICD-10-CM

## 2021-07-05 DIAGNOSIS — Z1211 Encounter for screening for malignant neoplasm of colon: Secondary | ICD-10-CM

## 2021-07-05 DIAGNOSIS — E785 Hyperlipidemia, unspecified: Secondary | ICD-10-CM | POA: Diagnosis not present

## 2021-07-05 DIAGNOSIS — F411 Generalized anxiety disorder: Secondary | ICD-10-CM

## 2021-07-05 MED ORDER — IRBESARTAN 75 MG PO TABS
75.0000 mg | ORAL_TABLET | Freq: Every day | ORAL | 3 refills | Status: DC
Start: 2021-07-05 — End: 2022-07-04

## 2021-07-05 MED ORDER — LEVOTHYROXINE SODIUM 50 MCG PO TABS
ORAL_TABLET | ORAL | 3 refills | Status: DC
Start: 2021-07-05 — End: 2022-07-16

## 2021-07-05 MED ORDER — SERTRALINE HCL 50 MG PO TABS: 50.0000 mg | ORAL_TABLET | Freq: Every day | ORAL | 3 refills | Status: DC

## 2021-07-05 NOTE — Patient Instructions (Signed)
Vous recevrez des appels tlphoniques pour la coloscopie et pour [employer] sommeil.  Nous rpterons vos laboratoires dans 2 mois.  Ravi de vous voir!   You will receive phone calls for the colonoscopy and for the sleep specialist.   We will repeat your labs in 2 months.  Nice to see you!

## 2021-07-05 NOTE — Assessment & Plan Note (Signed)
Stable.  Continue Imdur 15 mg daily. Will repeat lipids in 2 months.

## 2021-07-05 NOTE — Assessment & Plan Note (Signed)
She appears to be taking levothyroxine correctly. Continue levothyroxine 50 mcg, repeat TSH in a few months.

## 2021-07-05 NOTE — Assessment & Plan Note (Signed)
Ready to complete colonoscopy. Referral placed to GI.

## 2021-07-05 NOTE — Assessment & Plan Note (Signed)
Well-controlled on sertraline 50 mg, continue same.  Refill sent to pharmacy

## 2021-07-05 NOTE — Progress Notes (Signed)
Subjective:    Patient ID: Bethany Sharp, female    DOB: 09/05/1946, 75 y.o.   MRN: [id number]  HPI  Bethany Sharp is a very pleasant 75 y.o. female with a history of hypertension, CAD, SVT, hypothyroidism, GAD, hyperlipidemia who presents today for follow up.  She speaks mostly [place], her son is with her translating.  She is also due for repeat colonoscopy. Typically completed colonoscopies every 2 years due to numerous polyps. She is overdue since moving to the [place]. From [place].  She has her records from [place].  1) Hypothyroidism: Chronic. Managed on levothyroxine 50 mcg. She is taking every morning on an empty stomach with water only. TSH in February was 0.80.  She is needing refills.   2) Sleep Apnea: Diagnosed originally in [place]. Currently using a CPAP machine for which she brought from [place], has been using this for 10 years. Last sleep study was over 10 years ago.   She is requesting a new machine.   3) CAD/Hypertension: Following with cardiology, last visit in July 2022.  Lipid panel with LDL above goal, so atorvastatin was increased to 20 mg.  She is compliant to this daily and has been taking for about 3 weeks.  She will need repeat lipid panel in a few months.  Compliant to irbesartan 75 mg, Imdur 30 mg.  BP Readings from Last 3 Encounters:  07/05/21 126/72  05/07/21 124/78  03/29/21 128/74   4) Anxiety: Chronic.  Managed on sertraline 50 mg and feels well managed.  She is needing refills.   Review of Systems  Respiratory:  Negative for shortness of breath.   Cardiovascular:  Negative for chest pain.  Neurological:  Negative for dizziness.  Psychiatric/Behavioral:  The patient is not nervous/anxious.         Past Medical History:  Diagnosis Date   Arthritis    Depression    Hyperlipidemia    Thyroid disease     Social History   Socioeconomic History   Marital status: Divorced    Spouse name: Not on file   Number of children: Not on file   Years of  education: Not on file   Highest education level: Not on file  Occupational History   Not on file  Tobacco Use   Smoking status: Every Day    Packs/day: 0.25    Years: 57.00    Pack years: 14.25    Types: Cigarettes    Start date: 1962   Smokeless tobacco: Never   Tobacco comments:    smokes about 4 gigs a day  Substance and Sexual Activity   Alcohol use: Yes   Drug use: Not on file   Sexual activity: Not on file  Other Topics Concern   Not on file  Social History Narrative   Not on file   Social Determinants of Health   Financial Resource Strain: Not on file  Food Insecurity: Not on file  Transportation Needs: Not on file  Physical Activity: Not on file  Stress: Not on file  Social Connections: Not on file  Intimate Partner Violence: Not on file    Past Surgical History:  Procedure Laterality Date   APPENDECTOMY  1960    No family history on file.  No Known Allergies  Current Outpatient Medications on File Prior to Visit  Medication Sig Dispense Refill   aspirin EC 81 MG tablet Take 1 tablet (81 mg total) by mouth daily. Swallow whole. 90 tablet 3   atorvastatin (LIPITOR) 20  MG tablet Take 1 tablet (20 mg total) by mouth daily. 90 tablet 3   fluticasone (FLONASE) 50 MCG/ACT nasal spray Place 1 spray into both nostrils 2 (two) times daily. 16 g 0   irbesartan (AVAPRO) 75 MG tablet Take 75 mg by mouth daily.     isosorbide mononitrate (IMDUR) 30 MG 24 hr tablet Take 0.5 tablets (15 mg total) by mouth daily. 45 tablet 3   levocetirizine (XYZAL) 5 MG tablet TAKE 1 TABLET BY MOUTH EVERY DAY IN THE EVENING FOR ALLERGIES 90 tablet 1   levothyroxine (SYNTHROID) 50 MCG tablet Take 1 tablet by mouth every morning on an empty stomach with water only.  No food or other medications for 30 minutes. 90 tablet 3   sertraline (ZOLOFT) 50 MG tablet Take 50 mg by mouth daily.     No current facility-administered medications on file prior to visit.    Temp 97.8 F (36.6 C)  (Temporal)   Ht 5\' 4"  (1.626 m)   Wt 155 lb (70.3 kg)   BMI 26.61 kg/m  Objective:   Physical Exam Cardiovascular:     Rate and Rhythm: Normal rate and regular rhythm.  Pulmonary:     Effort: Pulmonary effort is normal.     Breath sounds: Normal breath sounds.  Musculoskeletal:     Cervical back: Neck supple.  Skin:    General: Skin is warm and dry.  Psychiatric:        Mood and Affect: Mood normal.          Assessment & Plan:      This visit occurred during the SARS-CoV-2 public health emergency.  Safety protocols were in place, including screening questions prior to the visit, additional usage of staff PPE, and extensive cleaning of exam room while observing appropriate contact time as indicated for disinfecting solutions.

## 2021-07-05 NOTE — Assessment & Plan Note (Signed)
LDL goal below 70.  Continue atorvastatin 20 mg. Repeat lipids in 2 months.

## 2021-07-05 NOTE — Assessment & Plan Note (Signed)
Controlled on irbesartan 35 mg, Imdur 15 mg. Continue same.

## 2021-07-20 NOTE — Progress Notes (Signed)
07/23/21- 75 yoF Smoker (14 pkyrs, 1-2 cigs/ day) for sleep evaluation courtesy of Alma Friendly, NP with concern of OSA, old CPAP. Medical problem list includes SVT, HTN, CAD, Hypothyroid, Tobacco Use, Lung Nodule, Hyperlipidemia, Anxiety Moved from [place] 1 year ago, speaking mostly [place]. CPAP 75 yrs old, no recent sleep study. Epworth score-4 Body weight today-158 lbs Covid vax-no Flu vax-no Son is here as Optometrist Original sleep study in [place] and using currently on [place] on unknown settings. Sleeps comfortably with it. Originally snored. Never great sleeper. No ENT surgery. Not aware of lung disease. We reviewed alternatives to CPAP but she is used to it and prefers to stick with CPAP.  Prior to Admission medications   Medication Sig Start Date End Date Taking? Authorizing Provider  aspirin EC 81 MG tablet Take 1 tablet (81 mg total) by mouth daily. Swallow whole. 01/18/21  Yes Chandrasekhar, Mahesh A, MD  atorvastatin (LIPITOR) 20 MG tablet Take 1 tablet (20 mg total) by mouth daily. 05/09/21  Yes Chandrasekhar, Mahesh A, MD  irbesartan (AVAPRO) 75 MG tablet Take 1 tablet (75 mg total) by mouth daily. For blood pressure. 07/05/21  Yes [name], NP  isosorbide mononitrate (IMDUR) 30 MG 24 hr tablet Take 0.5 tablets (15 mg total) by mouth daily. 05/07/21  Yes Chandrasekhar, Mahesh A, MD  levocetirizine (XYZAL) 5 MG tablet TAKE 1 TABLET BY MOUTH EVERY DAY IN THE EVENING FOR ALLERGIES 06/04/21  Yes [name], NP  levothyroxine (SYNTHROID) 50 MCG tablet Take 1 tablet by mouth every morning on an empty stomach with water only.  No food or other medications for 30 minutes. 07/05/21  Yes [name], NP  sertraline (ZOLOFT) 50 MG tablet Take 1 tablet (50 mg total) by mouth daily. For anxiety. 07/05/21  Yes [name], NP  fluticasone (FLONASE) 50 MCG/ACT nasal spray Place 1 spray into both nostrils 2 (two) times daily. Patient not taking: Reported on  07/23/2021 12/07/20   [name], NP   Past Medical History:  Diagnosis Date   Arthritis    Depression    Hyperlipidemia    Thyroid disease    Past Surgical History:  Procedure Laterality Date   APPENDECTOMY  1960   History reviewed. No pertinent family history. Social History   Socioeconomic History   Marital status: Divorced    Spouse name: Not on file   Number of children: Not on file   Years of education: Not on file   Highest education level: Not on file  Occupational History   Not on file  Tobacco Use   Smoking status: Every Day    Packs/day: 0.25    Years: 57.00    Pack years: 14.25    Types: Cigarettes    Start date: 1962   Smokeless tobacco: Never   Tobacco comments:    smokes about 4 gigs a day  Substance and Sexual Activity   Alcohol use: Yes   Drug use: Not on file   Sexual activity: Not on file  Other Topics Concern   Not on file  Social History Narrative   Not on file   Social Determinants of Health   Financial Resource Strain: Not on file  Food Insecurity: Not on file  Transportation Needs: Not on file  Physical Activity: Not on file  Stress: Not on file  Social Connections: Not on file  Intimate Partner Violence: Not on file   ROS-see HPI   + = positive Constitutional:  weight loss, night sweats, fevers, chills, fatigue, lassitude. HEENT:    headaches, difficulty swallowing, tooth/dental problems, sore throat,       sneezing, itching, ear ache, nasal congestion, post nasal drip, snoring CV:    chest pain, orthopnea, PND, swelling in lower extremities, anasarca,                                  dizziness, palpitations Resp:   shortness of breath with exertion or at rest.                productive cough,   non-productive cough, coughing up of blood.              change in color of mucus.  wheezing.   Skin:    rash or lesions. GI:  No-   heartburn, indigestion, abdominal pain, nausea, vomiting, diarrhea,                 change in  bowel habits, loss of appetite GU: dysuria, change in color of urine, no urgency or frequency.   flank pain. MS:   joint pain, stiffness, decreased range of motion, back pain. Neuro-     nothing unusual Psych:  change in mood or affect.  depression or anxiety.   memory loss.  OBJ- Physical Exam General- Alert, Oriented, Affect-appropriate, Distress- none acute, + not obese Skin- rash-none, lesions- none, excoriation- none Lymphadenopathy- none Head- atraumatic            Eyes- Gross vision intact, PERRLA, conjunctivae and secretions clear            Ears- Hearing, canals-normal            Nose- Clear, no-Septal dev, mucus, polyps, erosion, perforation             Throat- Mallampati III, mucosa clear , drainage- none, tonsils- atrophic, + teeth Neck- flexible , trachea midline, no stridor , thyroid nl, carotid no bruit Chest - symmetrical excursion , unlabored           Heart/CV- RRR , no murmur , no gallop  , no rub, nl s1 s2                           - JVD- none , edema- none, stasis changes- none, varices- none           Lung- clear to P&A, wheeze- none, cough- none , dullness-none, rub- none           Chest wall-  Abd-  Br/ Gen/ Rectal- Not done, not indicated Extrem- cyanosis- none, clubbing, none, atrophy- none, strength- nl Neuro- grossly intact to observation

## 2021-07-23 ENCOUNTER — Ambulatory Visit (INDEPENDENT_AMBULATORY_CARE_PROVIDER_SITE_OTHER): Payer: 59 | Admitting: Internal Medicine

## 2021-07-23 ENCOUNTER — Encounter: Payer: Self-pay | Admitting: Internal Medicine

## 2021-07-23 ENCOUNTER — Other Ambulatory Visit: Payer: Self-pay

## 2021-07-23 VITALS — BP 124/80 | HR 62 | Temp 98.8°F | Ht 64.96 in | Wt 158.4 lb

## 2021-07-23 DIAGNOSIS — Z720 Tobacco use: Secondary | ICD-10-CM | POA: Diagnosis not present

## 2021-07-23 DIAGNOSIS — G4733 Obstructive sleep apnea (adult) (pediatric): Secondary | ICD-10-CM

## 2021-07-23 NOTE — Patient Instructions (Addendum)
Order- schedule home sleep test    dx OSA The son is best contact due to Ms Altland's limited [place].  You can continue to use your current CPAP machine, except do not use it on the night of your sleep test.  Please call us about 2 weeks after your sleep test for results and recommendations.

## 2021-07-23 NOTE — Assessment & Plan Note (Signed)
She indicates only 1 cigarette daily and denies active lung disease.

## 2021-07-23 NOTE — Assessment & Plan Note (Signed)
Need to update documentation so we can help establish with DME and replace old machine. Plan- home sleep test. Son is translator and will need to stay in communication loop.

## 2021-07-27 ENCOUNTER — Encounter: Payer: Self-pay | Admitting: Physician Assistant

## 2021-08-16 ENCOUNTER — Ambulatory Visit (INDEPENDENT_AMBULATORY_CARE_PROVIDER_SITE_OTHER): Payer: 59 | Admitting: Physician Assistant

## 2021-08-16 ENCOUNTER — Encounter: Payer: Self-pay | Admitting: Physician Assistant

## 2021-08-16 VITALS — BP 126/76 | HR 76 | Ht 64.0 in | Wt 156.1 lb

## 2021-08-16 DIAGNOSIS — K317 Polyp of stomach and duodenum: Secondary | ICD-10-CM

## 2021-08-16 DIAGNOSIS — Z86010 Personal history of colonic polyps: Secondary | ICD-10-CM

## 2021-08-16 DIAGNOSIS — Z8719 Personal history of other diseases of the digestive system: Secondary | ICD-10-CM | POA: Diagnosis not present

## 2021-08-16 DIAGNOSIS — K219 Gastro-esophageal reflux disease without esophagitis: Secondary | ICD-10-CM | POA: Diagnosis not present

## 2021-08-16 DIAGNOSIS — K146 Glossodynia: Secondary | ICD-10-CM | POA: Diagnosis not present

## 2021-08-16 DIAGNOSIS — Z800 Family history of malignant neoplasm of digestive organs: Secondary | ICD-10-CM

## 2021-08-16 MED ORDER — PLENVU 140 G PO SOLR: 1.0000 | ORAL | 0 refills | Status: DC

## 2021-08-16 MED ORDER — PANTOPRAZOLE SODIUM 40 MG PO TBEC: 40.0000 mg | DELAYED_RELEASE_TABLET | Freq: Every day | ORAL | 6 refills | Status: DC

## 2021-08-16 NOTE — Patient Instructions (Signed)
If you are age 76 or older, your body mass index should be between 23-30. Your Body mass index is 26.8 kg/m. If this is out of the aforementioned range listed, please consider follow up with your Primary Care Provider. ________________________________________________________  The [facility] providers would like to encourage you to use [facility] to communicate with providers for non-urgent requests or questions.  Due to long hold times on the telephone, sending your provider a message by [facility] may be a faster and more efficient way to get a response.  Please allow 48 business hours for a response.  Please remember that this is for non-urgent requests.  _______________________________________________________  Bethany Sharp have been scheduled for an endoscopy. Please follow written instructions given to you at your visit today. If you use inhalers (even only as needed), please bring them with you on the day of your procedure.  START Pantoprazole 40 mg 1 tablet prior to breakfast  Follow up pending the results of your Colonoscopy/Endoscopy.  Thank you for entrusting me with your care and choosing [facility].  Amy Esterwood, PA-C

## 2021-08-16 NOTE — Progress Notes (Signed)
Addendum: Reviewed and agree with assessment and management plan. Nain Rudd M, MD  

## 2021-08-16 NOTE — Progress Notes (Signed)
Subjective:    Patient ID: Bethany Sharp, female    DOB: 1946/10/04, 75 y.o.   MRN: [id number]  HPI Bethany Sharp is a pleasant 75 year old white female, new to GI today referred by Jerrel Ivory, NP to discuss follow-up colonoscopy.  Patient is French-speaking and recently relocated to the [place].  She is accompanied by her son today who interprets for her. Patient relates a family history of colon cancer in her sister diagnosed in her early 76s and subsequently deceased secondary to her cancer.  Patient has undergone numerous colonoscopies and has history of multiple colon polyps.  She says she had been having colonoscopy every 4 years and after a colonoscopy in 2018 at which time she was found to have a larger polyp which it sounds like had to be removed piecemeal and on more than 1 setting he was told to have colonoscopy every 2 years.  She has brought colonoscopy reports with her which are in [place], no path reports. She is not currently having any problems with abdominal pain or rectal bleeding or changes in bowel habits, she will occasionally have an episode of diarrhea.  She says she is due for another colonoscopy.  She has also had prior EGD by her report and has been told that she has history of esophagitis and gastritis.  She has also had gastric polyps.  It sounds as if she had been tried on an acid blocker in the past without any benefit.  She complains of a burning sensation at the base of her tongue and an acid he type of sensation in her mouth, she does not have any symptomatic heartburn or indigestion no dysphagia or odynophagia.  She has had B12 levels checked in the recent past which had been normal.  She would like to try medication if this is would be potentially helpful.  He does have history of hypertension, coronary artery disease, no prior MI or stents and being managed medically currently.  History of SVT, sleep apnea hypothyroidism and anxiety.  Review of Systems.   Pertinent positive and negative review of systems were noted in the above HPI section.  All other review of systems was otherwise negative.   Outpatient Encounter Medications as of 08/16/2021  Medication Sig   aspirin EC 81 MG tablet Take 1 tablet (81 mg total) by mouth daily. Swallow whole.   atorvastatin (LIPITOR) 20 MG tablet Take 1 tablet (20 mg total) by mouth daily.   fluticasone (FLONASE) 50 MCG/ACT nasal spray Place 1 spray into both nostrils 2 (two) times daily.   irbesartan (AVAPRO) 75 MG tablet Take 1 tablet (75 mg total) by mouth daily. For blood pressure.   isosorbide mononitrate (IMDUR) 30 MG 24 hr tablet Take 0.5 tablets (15 mg total) by mouth daily.   levocetirizine (XYZAL) 5 MG tablet TAKE 1 TABLET BY MOUTH EVERY DAY IN THE EVENING FOR ALLERGIES   levothyroxine (SYNTHROID) 50 MCG tablet Take 1 tablet by mouth every morning on an empty stomach with water only.  No food or other medications for 30 minutes.   pantoprazole (PROTONIX) 40 MG tablet Take 1 tablet (40 mg total) by mouth daily with breakfast.   PEG-KCl-NaCl-NaSulf-Na Asc-C (PLENVU) 140 g SOLR Take 1 kit by mouth as directed.   sertraline (ZOLOFT) 50 MG tablet Take 1 tablet (50 mg total) by mouth daily. For anxiety.   No facility-administered encounter medications on file as of 08/16/2021.   Not on File Patient Active Problem List   Diagnosis Date  Noted   OSA (obstructive sleep apnea) 07/23/2021   Hyperlipidemia 03/29/2021   Coronary artery disease of native artery of native heart with stable angina pectoris ([place]) 01/18/2021   SVT (supraventricular tachycardia) ([place]) 01/18/2021   Tobacco abuse 01/18/2021   Lung nodule 12/07/2020   Family history of aortic dissection 12/07/2020   Family history of colon cancer 12/07/2020   Sensation of fullness in both ears 12/07/2020   Post-nasal drip 12/07/2020   Essential hypertension 05/26/2019   GAD (generalized anxiety disorder) 05/26/2019   Hypothyroidism 05/26/2019    Chronic foot pain 05/26/2019   Social History   Socioeconomic History   Marital status: Divorced    Spouse name: Not on file   Number of children: 1   Years of education: Not on file   Highest education level: Not on file  Occupational History   Not on file  Tobacco Use   Smoking status: Every Day    Packs/day: 0.25    Years: 57.00    Pack years: 14.25    Types: Cigarettes    Start date: 1962   Smokeless tobacco: Never   Tobacco comments:    smokes about 4 gigs a day  Vaping Use   Vaping Use: Never used  Substance and Sexual Activity   Alcohol use: Yes    Comment: 2 per day   Drug use: Not on file   Sexual activity: Not on file  Other Topics Concern   Not on file  Social History Narrative   Not on file   Social Determinants of Health   Financial Resource Strain: Not on file  Food Insecurity: Not on file  Transportation Needs: Not on file  Physical Activity: Not on file  Stress: Not on file  Social Connections: Not on file  Intimate Partner Violence: Not on file    Ms. Marut's family history includes Colon cancer in her sister; Coronary artery disease in her mother.      Objective:    Vitals:   08/16/21 1138  BP: 126/76  Pulse: 76    Physical Exam. Well-developed well-nourished older white female in no acute distress.  Accompanied by her son who interprets for her  Weight, 156 BMI 26.8  HEENT; nontraumatic normocephalic, EOMI, PE R LA, sclera anicteric. Oropharynx; not examined today Neck; supple, no JVD Cardiovascular; regular rate and rhythm with S1-S2, no murmur rub or gallop Pulmonary; Clear bilaterally Abdomen; soft, nontender, nondistended,, small umbilical hernia, no palpable mass or hepatosplenomegaly, bowel sounds are active Rectal; not done today Skin; benign exam, no jaundice rash or appreciable lesions Extremities; no clubbing cyanosis or edema skin warm and dry Neuro/Psych; alert and oriented x4, grossly nonfocal mood and affect  appropriate        Assessment & Plan:   #3 75 year old white female, French-speaking with family history of colon cancer diagnosed in her sister at a very young age.  Patient has personal history of multiple colon polyps and had been undergoing colonoscopy every 4 years.  At colonoscopy in 2018 she had a larger polyp which required sounds like piecemeal removal and more than 1 colonoscopy and was told thereafter to have every 2 year colonoscopy. (She brought colonoscopy reports with her, no path reports, and in [place])  #2 history of previous esophagitis, gastritis, gastric polyps, and chronic burning sensation in the mouth and the base of the tongue.  Rule out chronic GERD, rule out Barretts, rule out intestinal metaplasia  #3 hypertension #4.  Coronary artery disease being managed medically,  no prior stents or MI #5 anxiety #6.  History of sleep apnea #7.  History of SVT  Plan; Will start trial of Protonix 40 mg p.o. every morning AC breakfast I have asked her to take this for at least a month to see if will help with symptoms. Patient will be scheduled for EGD and colonoscopy with Dr. Hilarie Fredrickson.  Both procedures were discussed in detail with the patient including indications risks and benefits and she is agreeable to proceed. As of today's visit patient is appropriate for endoscopic evaluation in the ambulatory care setting Procedure reports and photos the patient provided will be scanned into Epic  Jaylan Hinojosa Genia Harold PA-C 08/16/2021   Cc: [name], NP

## 2021-09-03 ENCOUNTER — Other Ambulatory Visit: Payer: 59

## 2021-09-04 ENCOUNTER — Other Ambulatory Visit: Payer: 59

## 2021-09-12 ENCOUNTER — Other Ambulatory Visit: Payer: 59

## 2021-09-19 ENCOUNTER — Other Ambulatory Visit: Payer: Self-pay

## 2021-09-19 ENCOUNTER — Other Ambulatory Visit (INDEPENDENT_AMBULATORY_CARE_PROVIDER_SITE_OTHER): Payer: 59

## 2021-09-19 ENCOUNTER — Other Ambulatory Visit: Payer: Self-pay | Admitting: Primary Care

## 2021-09-19 DIAGNOSIS — E039 Hypothyroidism, unspecified: Secondary | ICD-10-CM

## 2021-09-19 DIAGNOSIS — I10 Essential (primary) hypertension: Secondary | ICD-10-CM

## 2021-09-19 DIAGNOSIS — E785 Hyperlipidemia, unspecified: Secondary | ICD-10-CM

## 2021-09-19 LAB — HEPATIC FUNCTION PANEL
ALT: 17 U/L (ref 0–35)
AST: 19 U/L (ref 0–37)
Albumin: 4.2 g/dL (ref 3.5–5.2)
Alkaline Phosphatase: 71 U/L (ref 39–117)
Bilirubin, Direct: 0.2 mg/dL (ref 0.0–0.3)
Total Bilirubin: 1 mg/dL (ref 0.2–1.2)
Total Protein: 6.4 g/dL (ref 6.0–8.3)

## 2021-09-19 LAB — LIPID PANEL
Cholesterol: 172 mg/dL (ref 0–200)
HDL: 75.2 mg/dL (ref >39.00)
LDL Cholesterol: 72 mg/dL (ref 0–99)
NonHDL: 96.5
Total CHOL/HDL Ratio: 2
Triglycerides: 124 mg/dL (ref 0.0–149.0)
VLDL: 24.8 mg/dL (ref 0.0–40.0)

## 2021-09-19 LAB — COMPREHENSIVE METABOLIC PANEL
ALT: 17 U/L (ref 0–35)
AST: 19 U/L (ref 0–37)
Albumin: 4.2 g/dL (ref 3.5–5.2)
Alkaline Phosphatase: 71 U/L (ref 39–117)
BUN: 13 mg/dL (ref 6–23)
CO2: 29 mEq/L (ref 19–32)
Calcium: 9.5 mg/dL (ref 8.4–10.5)
Chloride: 104 mEq/L (ref 96–112)
Creatinine, Ser: 0.73 mg/dL (ref 0.40–1.20)
GFR: 79.75 mL/min (ref >60.00)
Glucose, Bld: 104 mg/dL — ABNORMAL HIGH (ref 70–99)
Potassium: 4.6 mEq/L (ref 3.5–5.1)
Sodium: 139 mEq/L (ref 135–145)
Total Bilirubin: 1 mg/dL (ref 0.2–1.2)
Total Protein: 6.4 g/dL (ref 6.0–8.3)

## 2021-09-19 LAB — TSH: TSH: 0.37 u[IU]/mL (ref 0.35–5.50)

## 2021-09-21 ENCOUNTER — Encounter: Payer: 59 | Admitting: Internal Medicine

## 2021-10-04 ENCOUNTER — Encounter: Payer: 59 | Admitting: Internal Medicine

## 2021-10-18 ENCOUNTER — Ambulatory Visit (INDEPENDENT_AMBULATORY_CARE_PROVIDER_SITE_OTHER): Payer: 59

## 2021-10-18 ENCOUNTER — Other Ambulatory Visit: Payer: Self-pay

## 2021-10-18 DIAGNOSIS — G4733 Obstructive sleep apnea (adult) (pediatric): Secondary | ICD-10-CM

## 2021-11-01 ENCOUNTER — Ambulatory Visit (AMBULATORY_SURGERY_CENTER): Payer: 59 | Admitting: Internal Medicine

## 2021-11-01 ENCOUNTER — Encounter: Payer: Self-pay | Admitting: Internal Medicine

## 2021-11-01 VITALS — BP 133/64 | HR 47 | Temp 97.1°F | Resp 17 | Ht 64.0 in | Wt 156.0 lb

## 2021-11-01 DIAGNOSIS — K2950 Unspecified chronic gastritis without bleeding: Secondary | ICD-10-CM | POA: Diagnosis not present

## 2021-11-01 DIAGNOSIS — K2100 Gastro-esophageal reflux disease with esophagitis, without bleeding: Secondary | ICD-10-CM

## 2021-11-01 DIAGNOSIS — K2970 Gastritis, unspecified, without bleeding: Secondary | ICD-10-CM

## 2021-11-01 DIAGNOSIS — K222 Esophageal obstruction: Secondary | ICD-10-CM

## 2021-11-01 DIAGNOSIS — K209 Esophagitis, unspecified without bleeding: Secondary | ICD-10-CM

## 2021-11-01 DIAGNOSIS — K219 Gastro-esophageal reflux disease without esophagitis: Secondary | ICD-10-CM | POA: Diagnosis not present

## 2021-11-01 DIAGNOSIS — K3189 Other diseases of stomach and duodenum: Secondary | ICD-10-CM | POA: Diagnosis not present

## 2021-11-01 DIAGNOSIS — G4733 Obstructive sleep apnea (adult) (pediatric): Secondary | ICD-10-CM | POA: Diagnosis not present

## 2021-11-01 DIAGNOSIS — D122 Benign neoplasm of ascending colon: Secondary | ICD-10-CM | POA: Diagnosis not present

## 2021-11-01 DIAGNOSIS — K317 Polyp of stomach and duodenum: Secondary | ICD-10-CM | POA: Diagnosis not present

## 2021-11-01 DIAGNOSIS — Z800 Family history of malignant neoplasm of digestive organs: Secondary | ICD-10-CM | POA: Diagnosis not present

## 2021-11-01 DIAGNOSIS — D125 Benign neoplasm of sigmoid colon: Secondary | ICD-10-CM | POA: Diagnosis not present

## 2021-11-01 DIAGNOSIS — I2510 Atherosclerotic heart disease of native coronary artery without angina pectoris: Secondary | ICD-10-CM | POA: Diagnosis not present

## 2021-11-01 DIAGNOSIS — Z86010 Personal history of colonic polyps: Secondary | ICD-10-CM | POA: Diagnosis not present

## 2021-11-01 DIAGNOSIS — K2980 Duodenitis without bleeding: Secondary | ICD-10-CM

## 2021-11-01 DIAGNOSIS — K635 Polyp of colon: Secondary | ICD-10-CM | POA: Diagnosis not present

## 2021-11-01 DIAGNOSIS — D123 Benign neoplasm of transverse colon: Secondary | ICD-10-CM

## 2021-11-01 MED ORDER — PANTOPRAZOLE SODIUM 40 MG PO TBEC: 40.0000 mg | DELAYED_RELEASE_TABLET | Freq: Two times a day (BID) | ORAL | 6 refills | Status: DC

## 2021-11-01 MED ORDER — SODIUM CHLORIDE 0.9 % IV SOLN: 500.0000 mL | Freq: Once | INTRAVENOUS | Status: DC

## 2021-11-01 NOTE — Op Note (Signed)
[facility] Patient Name: Bethany Sharp Procedure Date: 11/01/2021 3:30 PM MRN: [id number] Endoscopist: Jerene Bears , MD Age: 76 Referring MD:  Date of Birth: 08-Jan-1946 Gender: Female Account #: [id number] Procedure:                Upper GI endoscopy Indications:              Gastro-esophageal reflux disease, burning tongue                            pain, throat clearing, history of gastritis Medicines:                Monitored Anesthesia Care Procedure:                Pre-Anesthesia Assessment:                           - Prior to the procedure, a History and Physical                            was performed, and patient medications and                            allergies were reviewed. The patient's tolerance of                            previous anesthesia was also reviewed. The risks                            and benefits of the procedure and the sedation                            options and risks were discussed with the patient.                            All questions were answered, and informed consent                            was obtained. Prior Anticoagulants: The patient has                            taken no previous anticoagulant or antiplatelet                            agents. ASA Grade Assessment: III - A patient with                            severe systemic disease. After reviewing the risks                            and benefits, the patient was deemed in                            satisfactory condition to undergo the procedure.  After obtaining informed consent, the endoscope was                            passed under direct vision. Throughout the                            procedure, the patient's blood pressure, pulse, and                            oxygen saturations were monitored continuously. The                            Endoscope was introduced through the mouth, and                            advanced to  the second part of duodenum. The upper                            GI endoscopy was accomplished without difficulty.                            The patient tolerated the procedure well. Scope In: Scope Out: Findings:                 LA Grade B (one or more mucosal breaks greater than                            5 mm, not extending between the tops of two mucosal                            folds) esophagitis with no bleeding was found 39 to                            40 cm from the incisors.                           A non-obstructing, partial, Schatzki ring was found                            at the gastroesophageal junction.                           Moderate inflammation characterized by erosions and                            erythema was found in the gastric antrum. Biopsies                            were taken with a cold forceps for histology and                            Helicobacter pylori testing.  Mild inflammation characterized by erythema was                            found in the duodenal bulb.                           A single 7 mm nodule (possible polyp) was found in                            the second portion of the duodenum. Biopsies were                            taken with a cold forceps for histology to exclude                            adenoma. Complications:            No immediate complications. Estimated Blood Loss:     Estimated blood loss was minimal. Impression:               - LA Grade B reflux esophagitis with no bleeding.                           - Non-obstructing Schatzki ring.                           - Gastritis. Biopsied.                           - Duodenitis.                           - Nodule versus polyp found in the second part of                            the duodenum. Biopsied to exclude adenoma. Recommendation:           - Patient has a contact number available for                            emergencies. The  signs and symptoms of potential                            delayed complications were discussed with the                            patient. Return to normal activities tomorrow.                            Written discharge instructions were provided to the                            patient.                           - Resume previous diet.                           -  Continue present medications.                           - Increase pantoprazole to 40 mg twice daily (best                            taken 30 min before 1st and last meal of the day).                           - Await pathology results. Jerene Bears, MD 11/01/2021 4:19:54 PM This report has been signed electronically.

## 2021-11-01 NOTE — Op Note (Signed)
[facility] Patient Name: Bethany Sharp Procedure Date: 11/01/2021 3:31 PM MRN: [id number] Endoscopist: Jerene Bears , MD Age: 76 Referring MD:  Date of Birth: 1946-07-25 Gender: Female Account #: [id number] Procedure:                Colonoscopy Indications:              High risk colon cancer surveillance: Personal                            history of colonic polyps, Family history of colon                            cancer in a first-degree relative, Last                            colonoscopy: 2018 (in [place]) Medicines:                Monitored Anesthesia Care Procedure:                Pre-Anesthesia Assessment:                           - Prior to the procedure, a History and Physical                            was performed, and patient medications and                            allergies were reviewed. The patient's tolerance of                            previous anesthesia was also reviewed. The risks                            and benefits of the procedure and the sedation                            options and risks were discussed with the patient.                            All questions were answered, and informed consent                            was obtained. Prior Anticoagulants: The patient has                            taken no previous anticoagulant or antiplatelet                            agents. ASA Grade Assessment: III - A patient with                            severe systemic disease. After reviewing the risks  and benefits, the patient was deemed in                            satisfactory condition to undergo the procedure.                           After obtaining informed consent, the colonoscope                            was passed under direct vision. Throughout the                            procedure, the patient's blood pressure, pulse, and                            oxygen saturations were monitored  continuously. The                            Olympus PCF-H190DL [id number] [id number]) Colonoscope was                            introduced through the anus and advanced to the                            cecum, identified by appendiceal orifice and                            ileocecal valve. The colonoscopy was performed                            without difficulty. The patient tolerated the                            procedure well. The quality of the bowel                            preparation was good. The ileocecal valve,                            appendiceal orifice, and rectum were photographed. Scope In: 3:54:03 PM Scope Out: 4:10:47 PM Scope Withdrawal Time: 0 hours 12 minutes 21 seconds  Total Procedure Duration: 0 hours 16 minutes 44 seconds  Findings:                 The digital rectal exam was normal.                           Three sessile polyps were found in the ascending                            colon. The polyps were 3 to 7 mm in size. These                            polyps were removed with a cold snare. Resection  and retrieval were complete.                           Two sessile polyps were found in the transverse                            colon. The polyps were 5 to 6 mm in size. These                            polyps were removed with a cold snare. Resection                            was complete, but the polyp tissue was only                            partially retrieved.                           A 5 mm polyp was found in the splenic flexure. The                            polyp was sessile. The polyp was removed with a                            cold snare. Resection and retrieval were complete.                           A 5 mm polyp was found in the sigmoid colon. The                            polyp was sessile. The polyp was removed with a                            cold snare. Resection and retrieval were complete.                            Scattered small-mouthed diverticula were found in                            the sigmoid colon and descending colon.                           Internal hemorrhoids were found during                            retroflexion. The hemorrhoids were small. Complications:            No immediate complications. Estimated Blood Loss:     Estimated blood loss was minimal. Impression:               - Three 3 to 7 mm polyps in the ascending colon,                            removed with  a cold snare. Resected and retrieved.                           - Two 5 to 6 mm polyps in the transverse colon,                            removed with a cold snare. Complete resection.                            Partial retrieval.                           - One 5 mm polyp at the splenic flexure, removed                            with a cold snare. Resected and retrieved.                           - One 5 mm polyp in the sigmoid colon, removed with                            a cold snare. Resected and retrieved.                           - Diverticulosis in the sigmoid colon and in the                            descending colon.                           - Small internal hemorrhoids. Recommendation:           - Patient has a contact number available for                            emergencies. The signs and symptoms of potential                            delayed complications were discussed with the                            patient. Return to normal activities tomorrow.                            Written discharge instructions were provided to the                            patient.                           - Resume previous diet.                           - Continue present medications.                           -  Await pathology results.                           - Repeat colonoscopy may be recommended for                            surveillance. The colonoscopy date will be                             determined after pathology results from today's                            exam become available for review. Jerene Bears, MD 11/01/2021 4:23:39 PM This report has been signed electronically.

## 2021-11-01 NOTE — Progress Notes (Signed)
Cena Benton is pt's Interpreter.  Pt speaks [place].     DT - VS

## 2021-11-01 NOTE — Progress Notes (Signed)
Report given to PACU, vss 

## 2021-11-01 NOTE — Progress Notes (Signed)
1542 Robinul 0.1 mg IV given due large amount of secretions upon assessment.  MD made aware, vss

## 2021-11-01 NOTE — Progress Notes (Signed)
Called to room to assist during endoscopic procedure.  Patient ID and intended procedure confirmed with present staff. Received instructions for my participation in the procedure from the performing physician.  

## 2021-11-01 NOTE — Patient Instructions (Signed)
YOU HAD AN ENDOSCOPIC PROCEDURE TODAY AT [facility]:   Refer to the procedure report that was given to you for any specific questions about what was found during the examination.  If the procedure report does not answer your questions, please call your gastroenterologist to clarify.  If you requested that your care partner not be given the details of your procedure findings, then the procedure report has been included in a sealed envelope for you to review at your convenience later.  **Handouts given on Gastritis, Esophagitis, Polyps, Diverticulosis and Hemorrhoids**  YOU SHOULD EXPECT: Some feelings of bloating in the abdomen. Passage of more gas than usual.  Walking can help get rid of the air that was put into your GI tract during the procedure and reduce the bloating. If you had a lower endoscopy (such as a colonoscopy or flexible sigmoidoscopy) you may notice spotting of blood in your stool or on the toilet paper. If you underwent a bowel prep for your procedure, you may not have a normal bowel movement for a few days.  Please Note:  You might notice some irritation and congestion in your nose or some drainage.  This is from the oxygen used during your procedure.  There is no need for concern and it should clear up in a day or so.  SYMPTOMS TO REPORT IMMEDIATELY:  Following lower endoscopy (colonoscopy or flexible sigmoidoscopy):  Excessive amounts of blood in the stool  Significant tenderness or worsening of abdominal pains  Swelling of the abdomen that is new, acute  Fever of 100F or higher  Following upper endoscopy (EGD)  Vomiting of blood or coffee ground material  New chest pain or pain under the shoulder blades  Painful or persistently difficult swallowing  New shortness of breath  Fever of 100F or higher  Black, tarry-looking stools  For urgent or emergent issues, a gastroenterologist can be reached at any hour by calling [phone]. Do not use MyChart  messaging for urgent concerns.    DIET:  We do recommend a small meal at first, but then you may proceed to your regular diet.  Drink plenty of fluids but you should avoid alcoholic beverages for 24 hours.  ACTIVITY:  You should plan to take it easy for the rest of today and you should NOT DRIVE or use heavy machinery until tomorrow (because of the sedation medicines used during the test).    FOLLOW UP: Our staff will call the number listed on your records 48-72 hours following your procedure to check on you and address any questions or concerns that you may have regarding the information given to you following your procedure. If we do not reach you, we will leave a message.  We will attempt to reach you two times.  During this call, we will ask if you have developed any symptoms of COVID 19. If you develop any symptoms (ie: fever, flu-like symptoms, shortness of breath, cough etc.) before then, please call [phone].  If you test positive for Covid 19 in the 2 weeks post procedure, please call and report this information to [place].    If any biopsies were taken you will be contacted by phone or by letter within the next 1-3 weeks.  Please call us at [phone] if you have not heard about the biopsies in 3 weeks.    SIGNATURES/CONFIDENTIALITY: You and/or your care partner have signed paperwork which will be entered into your electronic medical record.  These signatures attest to  the fact that that the information above on your After Visit Summary has been reviewed and is understood.  Full responsibility of the confidentiality of this discharge information lies with you and/or your care-partner.

## 2021-11-01 NOTE — Progress Notes (Signed)
GASTROENTEROLOGY PROCEDURE H&P NOTE   Primary Care Physician: [name], NP    Reason for Procedure:  History of GERD, gastritis, gastric polyps, burning in the tongue/throat, family history of colon cancer, personal history of multiple colon polyps  Plan:    EGD and colonoscopy  Patient is appropriate for endoscopic procedure(s) in the ambulatory ([place]) setting.  The nature of the procedure, as well as the risks, benefits, and alternatives were carefully and thoroughly reviewed with the patient. Ample time for discussion and questions allowed. The patient understood, was satisfied, and agreed to proceed.     HPI: Bethany Sharp is a 76 y.o. female who presents for EGD and colonoscopy.  Previous procedures done in [place].  See office note dated 08/16/2021 for details.  Tolerated the prep.  No recent chest pain or shortness of breath.  Past Medical History:  Diagnosis Date   Allergies    Anal fissure    Anxiety    Arthritis    CAD (coronary artery disease)    Clotting disorder (HCC)    Colon polyps    GERD (gastroesophageal reflux disease)    HTN (hypertension)    Hyperlipidemia    Sleep apnea    wears c-pap   Sleep apnea with use of continuous positive airway pressure (CPAP)    Thyroid disease    hypothyroidism    Past Surgical History:  Procedure Laterality Date   APPENDECTOMY  1960   CESAREAN SECTION     tummy tuck      Prior to Admission medications   Medication Sig Start Date End Date Taking? Authorizing Provider  aspirin EC 81 MG tablet Take 1 tablet (81 mg total) by mouth daily. Swallow whole. 01/18/21  Yes Chandrasekhar, Mahesh A, MD  atorvastatin (LIPITOR) 20 MG tablet Take 1 tablet (20 mg total) by mouth daily. 05/09/21  Yes Chandrasekhar, Mahesh A, MD  irbesartan (AVAPRO) 75 MG tablet Take 1 tablet (75 mg total) by mouth daily. For blood pressure. 07/05/21  Yes [name], NP  isosorbide mononitrate (IMDUR) 30 MG 24 hr tablet Take 0.5  tablets (15 mg total) by mouth daily. 05/07/21  Yes Chandrasekhar, Mahesh A, MD  levocetirizine (XYZAL) 5 MG tablet TAKE 1 TABLET BY MOUTH EVERY DAY IN THE EVENING FOR ALLERGIES 06/04/21  Yes [name], NP  levothyroxine (SYNTHROID) 50 MCG tablet Take 1 tablet by mouth every morning on an empty stomach with water only.  No food or other medications for 30 minutes. 07/05/21  Yes [name], NP  sertraline (ZOLOFT) 50 MG tablet Take 1 tablet (50 mg total) by mouth daily. For anxiety. 07/05/21  Yes [name], NP  fluticasone (FLONASE) 50 MCG/ACT nasal spray Place 1 spray into both nostrils 2 (two) times daily. 12/07/20   [name], NP  pantoprazole (PROTONIX) 40 MG tablet Take 1 tablet (40 mg total) by mouth daily with breakfast. 08/16/21   Esterwood, Amy S, PA-C    Current Outpatient Medications  Medication Sig Dispense Refill   aspirin EC 81 MG tablet Take 1 tablet (81 mg total) by mouth daily. Swallow whole. 90 tablet 3   atorvastatin (LIPITOR) 20 MG tablet Take 1 tablet (20 mg total) by mouth daily. 90 tablet 3   irbesartan (AVAPRO) 75 MG tablet Take 1 tablet (75 mg total) by mouth daily. For blood pressure. 90 tablet 3   isosorbide mononitrate (IMDUR) 30 MG 24 hr tablet Take 0.5 tablets (15 mg total) by mouth daily.  45 tablet 3   levocetirizine (XYZAL) 5 MG tablet TAKE 1 TABLET BY MOUTH EVERY DAY IN THE EVENING FOR ALLERGIES 90 tablet 1   levothyroxine (SYNTHROID) 50 MCG tablet Take 1 tablet by mouth every morning on an empty stomach with water only.  No food or other medications for 30 minutes. 90 tablet 3   sertraline (ZOLOFT) 50 MG tablet Take 1 tablet (50 mg total) by mouth daily. For anxiety. 90 tablet 3   fluticasone (FLONASE) 50 MCG/ACT nasal spray Place 1 spray into both nostrils 2 (two) times daily. 16 g 0   pantoprazole (PROTONIX) 40 MG tablet Take 1 tablet (40 mg total) by mouth daily with breakfast. 30 tablet 6   Current Facility-Administered Medications   Medication Dose Route Frequency Provider Last Rate Last Admin   0.9 %  sodium chloride infusion  500 mL Intravenous Once Koren Plyler, Lajuan Lines, MD        Allergies as of 11/01/2021   (Not on File)    Family History  Problem Relation Age of Onset   Coronary artery disease Mother    Colon cancer Sister    Esophageal cancer Neg Hx    Rectal cancer Neg Hx    Stomach cancer Neg Hx     Social History   Socioeconomic History   Marital status: Divorced    Spouse name: Not on file   Number of children: 1   Years of education: Not on file   Highest education level: Not on file  Occupational History   Not on file  Tobacco Use   Smoking status: Every Day    Packs/day: 0.25    Years: 57.00    Pack years: 14.25    Types: Cigarettes    Start date: 1962   Smokeless tobacco: Never   Tobacco comments:    smokes about 4 cigs a day  Vaping Use   Vaping Use: Never used  Substance and Sexual Activity   Alcohol use: Yes    Comment: wine 2 glasses per day   Drug use: Never   Sexual activity: Not on file  Other Topics Concern   Not on file  Social History Narrative   Not on file   Social Determinants of Health   Financial Resource Strain: Not on file  Food Insecurity: Not on file  Transportation Needs: Not on file  Physical Activity: Not on file  Stress: Not on file  Social Connections: Not on file  Intimate Partner Violence: Not on file    Physical Exam: Vital signs in last 24 hours: @BP  119/75    Pulse (!) 53    Temp (!) 97.1 F (36.2 C)    Ht 5\' 4"  (1.626 m)    Wt 156 lb (70.8 kg)    SpO2 96%    BMI 26.78 kg/m  GEN: NAD EYE: Sclerae anicteric ENT: MMM CV: Non-tachycardic Pulm: CTA b/l GI: Soft, NT/ND NEURO:  Alert & Oriented x 3   Bethany Jarred, MD [facility]  11/01/2021 3:34 PM

## 2021-11-02 DIAGNOSIS — G4733 Obstructive sleep apnea (adult) (pediatric): Secondary | ICD-10-CM | POA: Diagnosis not present

## 2021-11-05 ENCOUNTER — Telehealth: Payer: Self-pay | Admitting: *Deleted

## 2021-11-05 ENCOUNTER — Telehealth: Payer: Self-pay

## 2021-11-05 ENCOUNTER — Telehealth: Payer: Self-pay | Admitting: Internal Medicine

## 2021-11-05 DIAGNOSIS — G4733 Obstructive sleep apnea (adult) (pediatric): Secondary | ICD-10-CM

## 2021-11-05 NOTE — Telephone Encounter (Signed)
Home sleep test confirmed mild to moderate OSA, with AHI 14.6/ hr and drops in blood oxygen level.  Recommend order new DME, replace old CPAP machine, auto 5-15, mask of choice, humidifier, supplies, AirView/ card.  Return office visit 3 months  NOTE: Son is primary contact. He is translator because she has limited [place].

## 2021-11-05 NOTE — Telephone Encounter (Signed)
Please advise on sleep test results.

## 2021-11-05 NOTE — Telephone Encounter (Signed)
First Follow up call to translator, LVM.

## 2021-11-05 NOTE — Telephone Encounter (Signed)
°  Follow up Call-  Call back number 11/01/2021  Post procedure Call Back phone  # [phone] Vernella Niznik - she will call pt and ask how she is 0700 on Monday am.  Permission to leave phone message Yes     Patient questions:  Do you have a fever, pain , or abdominal swelling? No. Pain Score  0 *  Have you tolerated food without any problems? Yes.    Have you been able to return to your normal activities? Yes.    Do you have any questions about your discharge instructions: Diet   No. Medications  No. Follow up visit  No.  Do you have questions or concerns about your Care? No.  Actions: * If pain score is 4 or above: No action needed, pain <4.

## 2021-11-06 ENCOUNTER — Encounter: Payer: Self-pay | Admitting: Internal Medicine

## 2021-11-06 NOTE — Telephone Encounter (Signed)
Called and spoke with patient's son. He verbalized understanding of results and recommendations. He wishes to proceed with the new CPAP order. Patient is not established with a local DME since she just moved here from [place].   Order has been placed.   Nothing further needed at time of call.

## 2021-11-07 ENCOUNTER — Encounter: Payer: Self-pay | Admitting: Internal Medicine

## 2021-11-07 ENCOUNTER — Other Ambulatory Visit (HOSPITAL_BASED_OUTPATIENT_CLINIC_OR_DEPARTMENT_OTHER): Payer: Self-pay | Admitting: *Deleted

## 2021-11-07 MED ORDER — ASPIRIN EC 81 MG PO TBEC: 81.0000 mg | DELAYED_RELEASE_TABLET | Freq: Every day | ORAL | 1 refills | Status: DC

## 2021-11-15 ENCOUNTER — Other Ambulatory Visit: Payer: Self-pay | Admitting: Primary Care

## 2021-11-15 DIAGNOSIS — F411 Generalized anxiety disorder: Secondary | ICD-10-CM

## 2021-11-26 ENCOUNTER — Ambulatory Visit: Payer: 59 | Admitting: Internal Medicine

## 2021-11-28 NOTE — Progress Notes (Unsigned)
Cardiology Office Note:    Date:  11/28/2021   ID:  Bethany Sharp, DOB [birth year], MRN [id number]  PCP:  [name], NP   [place]  Cardiologist:  Rudean Haskell MD Advanced Practice Provider:  No care team member to display Electrophysiologist:  None      CC: CAD f/u  Interpretor Service Offered; deferred for son (again)  History of Present Illness:    Bethany Sharp is a 76 y.o. female with a hx of HLD, tobacco abuse, and family history of aortic dissection who presents for evaluation 12/18/20. Has had CCTA with obstructive disease and has deferred LHC, had SVT on low dose AV nodal agents.  Last seen mid 2022.  Patient notes that (s)he is doing ***.   Since day prior/last visit notes *** . There are no*** interval hospital/ED visit.    No chest pain or pressure ***.  No SOB/DOE*** and no PND/Orthopnea***.  No weight gain or leg swelling***.  No palpitations or syncope ***.  Ambulatory blood pressure ***.   Past Medical History:  Diagnosis Date   Allergies    Anal fissure    Anxiety    Arthritis    CAD (coronary artery disease)    Clotting disorder (HCC)    Colon polyps    GERD (gastroesophageal reflux disease)    HTN (hypertension)    Hyperlipidemia    Sleep apnea    wears c-pap   Sleep apnea with use of continuous positive airway pressure (CPAP)    Thyroid disease    hypothyroidism    Past Surgical History:  Procedure Laterality Date   APPENDECTOMY  1960   CESAREAN SECTION     tummy tuck      Current Medications: No outpatient medications have been marked as taking for the 11/30/21 encounter (Appointment) with Werner Lean, MD.     Allergies:   Patient has no allergy information on record.   Social History   Socioeconomic History   Marital status: Divorced    Spouse name: Not on file   Number of children: 1   Years of education: Not on file   Highest education level: Not on file  Occupational  History   Not on file  Tobacco Use   Smoking status: Every Day    Packs/day: 0.25    Years: 57.00    Pack years: 14.25    Types: Cigarettes    Start date: 1962   Smokeless tobacco: Never   Tobacco comments:    smokes about 4 cigs a day  Vaping Use   Vaping Use: Never used  Substance and Sexual Activity   Alcohol use: Yes    Comment: wine 2 glasses per day   Drug use: Never   Sexual activity: Not on file  Other Topics Concern   Not on file  Social History Narrative   Not on file   Social Determinants of Health   Financial Resource Strain: Not on file  Food Insecurity: Not on file  Transportation Needs: Not on file  Physical Activity: Not on file  Stress: Not on file  Social Connections: Not on file    Social: patient presents with son, [name], going back to [place] frequently  Family History: Grandfather died at age 38 from PE Grandmother died at age 45 from an aortic dissection. Sister died from colon cancer.  ROS:   Please see the history of present illness.     All other systems reviewed and are negative.  EKGs/Labs/Other Studies Reviewed:    The following studies were reviewed today:  EKG:   12/07/20: SR 64  Cardiac Event Monitoring: Date: 01/16/21 Results: Patient had a minimum heart rate of 46 bpm, maximum heart rate of 207 bpm, and average heart rate of 68 bpm. Predominant underlying rhythm was sinus rhythm. One run of non-sustained ventricular tachycardia occurred lasting 8 beats at longest with a max rate of 207 bpm at fastest. Multiple (~ 380) runs of supraventricular tachycardia occurred lasting 35 seconds at longest with a max rate of 95 bpm at fastest. Isolated PACs were occasional (1.3%), with rare couplets and triplets present. Isolated PVCs were rare (<1.0%), with rare couplets. Triggered and diary events associated with NSVT and PVCs.   Has many short runs of asymptomatic SVT with symptomatic PVCs and NSVT.  Cardiac  CT: Date:01/18/2021 Results: 1. Coronary calcium score of 74. This was 35 percentile for age and sex matched control.   2. Normal coronary origin with right dominance.   3. Mid LAD calcified plaque at bifurcation with diagonal. Possible severe ostial disease of diagonal branch (stair step artifact markedly reduces quality of study). Will attempt FFR analysis. If symptoms worsen or become more worrisome, recommend further cardiac testing.   IMPRESSION: No significant incidental findings. Calcified granuloma along the right posteromedial lung base near the diaphragm.    Recent Labs: 12/07/2020: Hemoglobin 13.8; Platelets 273.0 09/19/2021: ALT 17; ALT 17; BUN 13; Creatinine, Ser 0.73; Potassium 4.6; Sodium 139; TSH 0.37  Recent Lipid Panel    Component Value Date/Time   CHOL 172 09/19/2021 1103   CHOL 189 05/07/2021 0941   TRIG 124.0 09/19/2021 1103   HDL 75.20 09/19/2021 1103   HDL 93 05/07/2021 0941   CHOLHDL 2 09/19/2021 1103   VLDL 24.8 09/19/2021 1103   LDLCALC 72 09/19/2021 1103   LDLCALC 77 05/07/2021 0941   LDLDIRECT 132.0 12/07/2020 1620    Physical Exam:    VS:  There were no vitals taken for this visit.    Wt Readings from Last 3 Encounters:  11/01/21 70.8 kg  08/16/21 70.8 kg  07/23/21 71.8 kg   Gen: *** distress, *** obese/well nourished/malnourished   Neck: No JVD, *** carotid bruit Ears: Pilar Plate Sign Cardiac: No Rubs or Gallops, *** Murmur, ***cardia, *** radial pulses Respiratory: Clear to auscultation bilaterally, *** effort, ***  respiratory rate GI: Soft, nontender, non-distended *** MS: No *** edema; *** moves all extremities Integument: Skin feels *** Neuro:  At time of evaluation, alert and oriented to person/place/time/situation *** Psych: Normal affect, patient feels ***   ASSESSMENT:    No diagnosis found.   PLAN:    Coronary Artery Disease; Possibly obstructive SVT- presently asymptomatic Family history of AA Tobacco Abuse- 1  cigarette a day - asymptomatic  - anatomy: mLAD and D1 disease per CCTA - continue ASA 81 mg - continue atorvastatin 10 mg, goal LDL < 70 lipids for today - Continue 15 mg PO Imdur for CP - deferring BB for SVT given lack of Sx and low resting HR - discussed smoking cessation again - No AA or dilation - discussed the limitations of asymptomatic CA duplex and when to intervene - atypical claudication like pain; this may be related to language barrier, if this is still an issue in 2023 we will scan her legs - if worsening DOE or CP or if unable to tolerate Imdur we have discussed LHC ***   NSAID questions - Significant use of NSAIDs is associated with  increase in cardiovascular disease - we recommend the lowest dose for the least amount of time, and recommended cessation if feasbile     Medication Adjustments/Labs and Tests Ordered: Current medicines are reviewed at length with the patient today.  Concerns regarding medicines are outlined above.  No orders of the defined types were placed in this encounter.   No orders of the defined types were placed in this encounter.    There are no Patient Instructions on file for this visit.   Signed, Werner Lean, MD  11/28/2021 1:09 PM    [place]

## 2021-11-29 ENCOUNTER — Other Ambulatory Visit: Payer: Self-pay | Admitting: Primary Care

## 2021-11-29 DIAGNOSIS — B349 Viral infection, unspecified: Secondary | ICD-10-CM | POA: Diagnosis not present

## 2021-11-29 DIAGNOSIS — R0982 Postnasal drip: Secondary | ICD-10-CM

## 2021-11-30 ENCOUNTER — Ambulatory Visit: Payer: 59 | Admitting: Internal Medicine

## 2021-12-06 ENCOUNTER — Telehealth: Payer: Self-pay

## 2021-12-06 NOTE — Telephone Encounter (Signed)
PA submitted via CoverMyMeds.  Laya Shuffler Key: [id number] - PA Case [id number] Approved Pantoprazole Sodium 40MG  dr tablets, #60 for a 30 day supply Charity fundraiser PA Form [phone] NCPDP) [place] Nunnery Patient ID: [id number]

## 2021-12-19 DIAGNOSIS — G4733 Obstructive sleep apnea (adult) (pediatric): Secondary | ICD-10-CM | POA: Diagnosis not present

## 2021-12-20 ENCOUNTER — Other Ambulatory Visit: Payer: Self-pay | Admitting: Primary Care

## 2021-12-20 DIAGNOSIS — F411 Generalized anxiety disorder: Secondary | ICD-10-CM

## 2021-12-20 NOTE — Telephone Encounter (Signed)
Please call patient, and may be her son. ? ?Received refill request for sertraline (Zoloft), however a 1 year supply was provided in September 2022.  She needs to contact her pharmacy for a refill.  She could be refilling a prescription from an old bottle.  ?

## 2021-12-21 NOTE — Telephone Encounter (Signed)
Pt's son, [name], (DPR) returned call and I relayed message about refills available at pt's pharmacy. Son states he will call pharmacy.  ?

## 2021-12-21 NOTE — Telephone Encounter (Signed)
Left message to return call to our office.  

## 2021-12-28 ENCOUNTER — Encounter: Payer: Self-pay | Admitting: *Deleted

## 2022-01-03 NOTE — Progress Notes (Signed)
?Cardiology Office Note:   ? ?Date:  01/04/2022  ? ?ID:  Bethany Sharp, DOB [birth year], MRN [id number] ? ?PCP:  [name], NP ?  ?[place]  ?Cardiologist:  Rudean Haskell MD ?Advanced Practice Provider:  No care team member to display ?Electrophysiologist:  None  ?    ?CC: CAD f/u ?Interpretor International aid/development worker; deferred for son (again) ? ?History of Present Illness:   ? ?Bethany Sharp is a 76 y.o. female with a hx of HLD, history of tobacco abuse (distant) , and family history of aortic dissection who presents for evaluation  ?2022: asymptomatic SVT and positive CCTA; felt better and did not want cath.  Planned for medical therapy. ? ?Patient notes that she is doing .   ?No further leg or chest pain.  No SOB (her anginal equivalent. ?There are no interval hospital/ED visit.   ?Going back to [place] this summer. ?Down to one cigarette. ? ?No chest pain or pressure .  No SOB/DOE and no PND/Orthopnea.  No weight gain or leg swelling.  No palpitations or syncope . ? ?Past Medical History:  ?Diagnosis Date  ? Allergies   ? Anal fissure   ? Anxiety   ? Arthritis   ? CAD (coronary artery disease)   ? Clotting disorder ([place])   ? Colon polyps   ? Diverticulosis   ? GERD (gastroesophageal reflux disease)   ? HTN (hypertension)   ? Hyperlipidemia   ? Internal hemorrhoids   ? Sleep apnea   ? wears c-pap  ? Sleep apnea with use of continuous positive airway pressure (CPAP)   ? Thyroid disease   ? hypothyroidism  ? Tubular adenoma of colon   ? ? ?Past Surgical History:  ?Procedure Laterality Date  ? APPENDECTOMY  1960  ? CESAREAN SECTION    ? tummy tuck    ? ? ?Current Medications: ?Current Meds  ?Medication Sig  ? aspirin EC 81 MG tablet Take 1 tablet (81 mg total) by mouth daily. Swallow whole.  ? atorvastatin (LIPITOR) 20 MG tablet Take 1 tablet (20 mg total) by mouth daily.  ? irbesartan (AVAPRO) 75 MG tablet Take 1 tablet (75 mg total) by mouth daily. For blood pressure.  ? isosorbide  mononitrate (IMDUR) 30 MG 24 hr tablet Take 0.5 tablets (15 mg total) by mouth daily.  ? levocetirizine (XYZAL) 5 MG tablet TAKE 1 TABLET BY MOUTH EVERY DAY IN THE EVENING FOR ALLERGIES  ? levothyroxine (SYNTHROID) 50 MCG tablet Take 1 tablet by mouth every morning on an empty stomach with water only.  No food or other medications for 30 minutes.  ? pantoprazole (PROTONIX) 40 MG tablet Take 1 tablet (40 mg total) by mouth 2 (two) times daily.  ? sertraline (ZOLOFT) 50 MG tablet Take 1 tablet (50 mg total) by mouth daily. For anxiety.  ?  ? ?Allergies:   Patient has no allergy information on record.  ? ?Social History  ? ?Socioeconomic History  ? Marital status: Divorced  ?  Spouse name: Not on file  ? Number of children: 1  ? Years of education: Not on file  ? Highest education level: Not on file  ?Occupational History  ? Not on file  ?Tobacco Use  ? Smoking status: Every Day  ?  Packs/day: 0.25  ?  Years: 57.00  ?  Pack years: 14.25  ?  Types: Cigarettes  ?  Start date: 1962  ? Smokeless tobacco: Never  ? Tobacco comments:  ?  smokes about 4 cigs a day  ?Vaping Use  ? Vaping Use: Never used  ?Substance and Sexual Activity  ? Alcohol use: Yes  ?  Comment: wine 2 glasses per day  ? Drug use: Never  ? Sexual activity: Not on file  ?Other Topics Concern  ? Not on file  ?Social History Narrative  ? Not on file  ? ?Social Determinants of Health  ? ?Financial Resource Strain: Not on file  ?Food Insecurity: Not on file  ?Transportation Needs: Not on file  ?Physical Activity: Not on file  ?Stress: Not on file  ?Social Connections: Not on file  ?  ?Social: patient presents with son, Bethany Sharp, going back to [place] for one month ? ?Family History: ?Grandfather died at age 77 from PE ?Grandmother died at age 89 from an aortic dissection. ?Sister died from colon cancer. ? ?ROS:   ?Please see the history of present illness.    ? All other systems reviewed and are negative. ? ?EKGs/Labs/Other Studies Reviewed:   ? ?The following  studies were reviewed today: ? ?EKG:   ?12/07/20: SR 64 ? ?Cardiac Event Monitoring: ?Date: 01/16/21 ?Results: ?Patient had a minimum heart rate of 46 bpm, maximum heart rate of 207 bpm, and average heart rate of 68 bpm. ?Predominant underlying rhythm was sinus rhythm. ?One run of non-sustained ventricular tachycardia occurred lasting 8 beats at longest with a max rate of 207 bpm at fastest. ?Multiple (~ 380) runs of supraventricular tachycardia occurred lasting 35 seconds at longest with a max rate of 95 bpm at fastest. ?Isolated PACs were occasional (1.3%), with rare couplets and triplets present. ?Isolated PVCs were rare (<1.0%), with rare couplets. ?Triggered and diary events associated with NSVT and PVCs. ?  ?Has many short runs of asymptomatic SVT with symptomatic PVCs and NSVT. ? ?Cardiac CT: ?Date:01/18/2021 ?Results: ?1. Coronary calcium score of 74. This was 6 percentile for age and ?sex matched control. ?  ?2. Normal coronary origin with right dominance. ?  ?3. Mid LAD calcified plaque at bifurcation with diagonal. Possible ?severe ostial disease of diagonal branch (stair step artifact ?markedly reduces quality of study). Will attempt FFR analysis. If ?symptoms worsen or become more worrisome, recommend further cardiac ?testing. ?  ?IMPRESSION: ?No significant incidental findings. Calcified granuloma along the ?right posteromedial lung base near the diaphragm. ?  ? ?Recent Labs: ?09/19/2021: ALT 17; ALT 17; BUN 13; Creatinine, Ser 0.73; Potassium 4.6; Sodium 139; TSH 0.37  ?Recent Lipid Panel ?   ?Component Value Date/Time  ? CHOL 172 09/19/2021 1103  ? CHOL 189 05/07/2021 0941  ? TRIG 124.0 09/19/2021 1103  ? HDL 75.20 09/19/2021 1103  ? HDL 93 05/07/2021 0941  ? CHOLHDL 2 09/19/2021 1103  ? VLDL 24.8 09/19/2021 1103  ? [place] 72 09/19/2021 1103  ? LDLCALC 77 05/07/2021 0941  ? LDLDIRECT 132.0 12/07/2020 1620  ? ? ? ?Physical Exam:   ? ?VS:  BP 128/70   Pulse 67   Ht '5\' 4"'$  (1.626 m)   Wt 157 lb 3.2 oz  (71.3 kg)   SpO2 97%   BMI 26.98 kg/m?    ? ?Wt Readings from Last 3 Encounters:  ?01/04/22 157 lb 3.2 oz (71.3 kg)  ?11/01/21 156 lb (70.8 kg)  ?08/16/21 156 lb 2 oz (70.8 kg)  ? ?Gen: No distress   ?Neck: No JVD ?Cardiac: No Rubs or Gallops, no Murmur, RRR +2 radial pulses ?Respiratory: Clear to auscultation bilaterally, normal effort, normal  respiratory rate ?GI: Soft, nontender,  non-distended  ?MS: No  edema;  moves all extremities ?Integument: Skin feels warm ?Neuro:  At time of evaluation, alert and oriented to person/place/time/situation  ?Psych: Normal affect, patient feels well ? ? ?ASSESSMENT:   ? ?No diagnosis found. ? ? ?PLAN:   ? ?Coronary Artery Disease; Possibly obstructive ?PSVT asymptomatic ?Family history of AA ?Tobacco Abuse- 1 cigarette a day ?- asymptomatic  ?- anatomy: mLAD and D1 disease per CCTA ?- continue ASA 81 mg ?- continue atorvastatin 10 mg, goal LDL < 70 lipids  (check 12/22) ?- Continue 15 mg PO Imdur for CP ?- deferring BB for SVT given lack of Sx ?- discussed smoking cessation again ?- No AA or dilation ?- atypical claudication has resolved, will not pursed duplex at this time ?- if worsening DOE or CP or if unable to tolerate Imdur we have discussed LHC ? ? ? ?One year me or APP ? ? ? ?Medication Adjustments/Labs and Tests Ordered: ?Current medicines are reviewed at length with the patient today.  Concerns regarding medicines are outlined above.  ?No orders of the defined types were placed in this encounter. ? ? ?No orders of the defined types were placed in this encounter. ? ? ? ?Patient Instructions  ?Medication Instructions:  ?Your physician recommends that you continue on your current medications as directed. Please refer to the Current Medication list given to you today. ? ?*If you need a refill on your cardiac medications before your next appointment, please call your pharmacy* ? ? ?Lab Work: ?NONE ?If you have labs (blood work) drawn today and your tests are completely  normal, you will receive your results only by: ?MyChart Message (if you have MyChart) OR ?A paper copy in the mail ?If you have any lab test that is abnormal or we need to change your treatment, we will call you

## 2022-01-04 ENCOUNTER — Ambulatory Visit: Payer: 59 | Admitting: Internal Medicine

## 2022-01-04 ENCOUNTER — Encounter: Payer: Self-pay | Admitting: Internal Medicine

## 2022-01-04 ENCOUNTER — Other Ambulatory Visit: Payer: Self-pay

## 2022-01-04 VITALS — BP 128/70 | HR 67 | Ht 64.0 in | Wt 157.2 lb

## 2022-01-04 DIAGNOSIS — I25118 Atherosclerotic heart disease of native coronary artery with other forms of angina pectoris: Secondary | ICD-10-CM

## 2022-01-04 DIAGNOSIS — Z720 Tobacco use: Secondary | ICD-10-CM

## 2022-01-04 DIAGNOSIS — I471 Supraventricular tachycardia: Secondary | ICD-10-CM | POA: Diagnosis not present

## 2022-01-04 NOTE — Patient Instructions (Signed)
Medication Instructions:  ?Your physician recommends that you continue on your current medications as directed. Please refer to the Current Medication list given to you today. ? ?*If you need a refill on your cardiac medications before your next appointment, please call your pharmacy* ? ? ?Lab Work: ?NONE ?If you have labs (blood work) drawn today and your tests are completely normal, you will receive your results only by: ?MyChart Message (if you have MyChart) OR ?A paper copy in the mail ?If you have any lab test that is abnormal or we need to change your treatment, we will call you to review the results. ? ? ?Testing/Procedures: ?NONE ? ? ?Follow-Up: ?At [facility], you and your health needs are our priority.  As part of our continuing mission to provide you with exceptional heart care, we have created designated Provider Care Teams.  These Care Teams include your primary Cardiologist (physician) and Advanced Practice Providers (APPs -  Physician Assistants and Nurse Practitioners) who all work together to provide you with the care you need, when you need it. ? ?We recommend signing up for the patient portal called "MyChart".  Sign up information is provided on this After Visit Summary.  MyChart is used to connect with patients for Virtual Visits (Telemedicine).  Patients are able to view lab/test results, encounter notes, upcoming appointments, etc.  Non-urgent messages can be sent to your provider as well.   ?To learn more about what you can do with MyChart, go to [email or web address].   ? ?Your next appointment:   ?1 year(s) ? ?The format for your next appointment:   ?In Person ? ?Provider:   ?Werner Lean, MD   ? ?

## 2022-01-09 ENCOUNTER — Other Ambulatory Visit: Payer: Self-pay | Admitting: Internal Medicine

## 2022-01-09 ENCOUNTER — Ambulatory Visit: Payer: 59 | Admitting: Internal Medicine

## 2022-01-09 ENCOUNTER — Encounter: Payer: Self-pay | Admitting: Internal Medicine

## 2022-01-09 VITALS — BP 126/82 | HR 68 | Ht 64.0 in | Wt 160.2 lb

## 2022-01-09 DIAGNOSIS — K2100 Gastro-esophageal reflux disease with esophagitis, without bleeding: Secondary | ICD-10-CM

## 2022-01-09 MED ORDER — DEXLANSOPRAZOLE 60 MG PO CPDR: 60.0000 mg | DELAYED_RELEASE_CAPSULE | Freq: Every day | ORAL | 3 refills | Status: DC

## 2022-01-09 NOTE — Patient Instructions (Addendum)
Stop Pantoprazole. ? ?Start Dexilant '60mg'$ - Take 1 capsule by mouth once daily.  ? ? ?Follow up on : 04/09/22 at 3:20pm  ? ? ?Gastroesophageal Reflux Disease, Adult ?Gastroesophageal reflux (GER) happens when acid from the stomach flows up into the tube that connects the mouth and the stomach (esophagus). Normally, food travels down the esophagus and stays in the stomach to be digested. However, when a person has GER, food and stomach acid sometimes move back up into the esophagus. If this becomes a more serious problem, the person may be diagnosed with a disease called gastroesophageal reflux disease (GERD). GERD occurs when the reflux: ?Happens often. ?Causes frequent or severe symptoms. ?Causes problems such as damage to the esophagus. ?When stomach acid comes in contact with the esophagus, the acid may cause inflammation in the esophagus. Over time, GERD may create small holes (ulcers) in the lining of the esophagus. ?What are the causes? ?This condition is caused by a problem with the muscle between the esophagus and the stomach (lower esophageal sphincter, or LES). Normally, the LES muscle closes after food passes through the esophagus to the stomach. When the LES is weakened or abnormal, it does not close properly, and that allows food and stomach acid to go back up into the esophagus. ?The LES can be weakened by certain dietary substances, medicines, and medical conditions, including: ?Tobacco use. ?Pregnancy. ?Having a hiatal hernia. ?Alcohol use. ?Certain foods and beverages, such as coffee, chocolate, onions, and peppermint. ?What increases the risk? ?You are more likely to develop this condition if you: ?Have an increased body weight. ?Have a connective tissue disorder. ?Take NSAIDs, such as ibuprofen. ?What are the signs or symptoms? ?Symptoms of this condition include: ?Heartburn. ?Difficult or painful swallowing and the feeling of having a lump in the throat. ?A bitter taste in the mouth. ?Bad breath  and having a large amount of saliva. ?Having an upset or bloated stomach and belching. ?Chest pain. Different conditions can cause chest pain. Make sure you see your health care provider if you experience chest pain. ?Shortness of breath or wheezing. ?Ongoing (chronic) cough or a nighttime cough. ?Wearing away of tooth enamel. ?Weight loss. ?How is this diagnosed? ?This condition may be diagnosed based on a medical history and a physical exam. To determine if you have mild or severe GERD, your health care provider may also monitor how you respond to treatment. You may also have tests, including: ?A test to examine your stomach and esophagus with a small camera (endoscopy). ?A test that measures the acidity level in your esophagus. ?A test that measures how much pressure is on your esophagus. ?A barium swallow or modified barium swallow test to show the shape, size, and functioning of your esophagus. ?How is this treated? ?Treatment for this condition may vary depending on how severe your symptoms are. Your health care provider may recommend: ?Changes to your diet. ?Medicine. ?Surgery. ?The goal of treatment is to help relieve your symptoms and to prevent complications. ?Follow these instructions at home: ?Eating and drinking ? ?Follow a diet as recommended by your health care provider. This may involve avoiding foods and drinks such as: ?Coffee and tea, with or without caffeine. ?Drinks that contain alcohol. ?Energy drinks and sports drinks. ?Carbonated drinks or sodas. ?Chocolate and cocoa. ?Peppermint and mint flavorings. ?Garlic and onions. ?Horseradish. ?Spicy and acidic foods, including peppers, chili powder, curry powder, vinegar, hot sauces, and barbecue sauce. ?Citrus fruit juices and citrus fruits, such as oranges, lemons, and limes. ?Tomato-based  foods, such as red sauce, chili, salsa, and pizza with red sauce. ?Fried and fatty foods, such as donuts, french fries, potato chips, and high-fat  dressings. ?High-fat meats, such as hot dogs and fatty cuts of red and white meats, such as rib eye steak, sausage, ham, and bacon. ?High-fat dairy items, such as whole milk, butter, and cream cheese. ?Eat small, frequent meals instead of large meals. ?Avoid drinking large amounts of liquid with your meals. ?Avoid eating meals during the 2-3 hours before bedtime. ?Avoid lying down right after you eat. ?Do not exercise right after you eat. ?Lifestyle ? ?Do not use any products that contain nicotine or tobacco. These products include cigarettes, chewing tobacco, and vaping devices, such as e-cigarettes. If you need help quitting, ask your health care provider. ?Try to reduce your stress by using methods such as yoga or meditation. If you need help reducing stress, ask your health care provider. ?If you are overweight, reduce your weight to an amount that is healthy for you. Ask your health care provider for guidance about a safe weight loss goal. ?General instructions ?Pay attention to any changes in your symptoms. ?Take over-the-counter and prescription medicines only as told by your health care provider. Do not take aspirin, ibuprofen, or other NSAIDs unless your health care provider told you to take these medicines. ?Wear loose-fitting clothing. Do not wear anything tight around your waist that causes pressure on your abdomen. ?Raise (elevate) the head of your bed about 6 inches (15 cm). You can use a wedge to do this. ?Avoid bending over if this makes your symptoms worse. ?Keep all follow-up visits. This is important. ?Contact a health care provider if: ?You have: ?New symptoms. ?Unexplained weight loss. ?Difficulty swallowing or it hurts to swallow. ?Wheezing or a persistent cough. ?A hoarse voice. ?Your symptoms do not improve with treatment. ?Get help right away if: ?You have sudden pain in your arms, neck, jaw, teeth, or back. ?You suddenly feel sweaty, dizzy, or light-headed. ?You have chest pain or shortness  of breath. ?You vomit and the vomit is green, yellow, or black, or it looks like blood or coffee grounds. ?You faint. ?You have stool that is red, bloody, or black. ?You cannot swallow, drink, or eat. ?These symptoms may represent a serious problem that is an emergency. Do not wait to see if the symptoms will go away. Get medical help right away. Call your local emergency services (911 in the [place] Do not drive yourself to the hospital. ?Summary ?Gastroesophageal reflux happens when acid from the stomach flows up into the esophagus. GERD is a disease in which the reflux happens often, causes frequent or severe symptoms, or causes problems such as damage to the esophagus. ?Treatment for this condition may vary depending on how severe your symptoms are. Your health care provider may recommend diet and lifestyle changes, medicine, or surgery. ?Contact a health care provider if you have new or worsening symptoms. ?Take over-the-counter and prescription medicines only as told by your health care provider. Do not take aspirin, ibuprofen, or other NSAIDs unless your health care provider told you to do so. ?Keep all follow-up visits as told by your health care provider. This is important. ?This information is not intended to replace advice given to you by your health care provider. Make sure you discuss any questions you have with your health care provider. ?Document Revised: 04/10/2020 Document Reviewed: 04/10/2020 ?Elsevier Patient Education ? [place]. ? ?Thank you for choosing me and [facility]. ? ?  [name] MD ? ? ?

## 2022-01-09 NOTE — Progress Notes (Signed)
? ?  Subjective:  ? ? Patient ID: Bethany Sharp, female    DOB: [birth year], 76 y.o.   MRN: [id number] ? ?HPI ?Bethany Sharp is a 76 year old female with a history of GERD with esophagitis, nonobstructing Schatzki's ring, gastritis with erosions, history of colon polyps and family history of colon cancer, diverticulosis and small hemorrhoids who is here for follow-up.  She is here today with a friend who helps translate [place].  She also has a history of CAD, hypertension, hyperlipidemia and sleep apnea. ? ?She came in January for upper endoscopy and colonoscopy. ?See those notes for details but this includes esophagitis and gastritis as well as removing colon polyps. ? ?She took pantoprazole 40 mg twice daily after upper endoscopy until 8 days ago.  She was still having some indigestion.  However she had much improved throat clearing and coughing.  Now that she is off of pantoprazole she has had recurrent coughing, near constant throat clearing and burning in the back of her throat and tongue.  She is not having dysphagia.  She does typically eat late at night and she often drinks wine with dinner.  No abdominal pain.  Regular bowel movements. ? ? ?Review of Systems ?As per HPI, otherwise negative ? ?Current Medications, Allergies, Past Medical History, Past Surgical History, Family History and Social History were reviewed in [employer] record. ?   ?Objective:  ? Physical Exam ?BP 126/82   Pulse 68   Ht '5\' 4"'$  (1.626 m)   Wt 160 lb 4 oz (72.7 kg)   BMI 27.51 kg/m?  ?Gen: awake, alert, NAD ?HEENT: anicteric  ?Neuro: nonfocal ? ? ?   ?Assessment & Plan:  ?76 year old female with a history of GERD with esophagitis, nonobstructing Schatzki's ring, gastritis with erosions, history of colon polyps and family history of colon cancer, diverticulosis and small hemorrhoids who is here for follow-up. ? ?GERD with esophagitis/LPR symptoms --Long discussion today regarding reflux and how this leads to  throat clearing and coughing.  She still having some heartburn but now worse with discontinuation of PPI.  She stopped it because someone in [place] told her she should not take this long-term.  We reviewed how the benefits of PPI outweigh the risk for her particularly in light of her esophagitis. ?--Given incomplete response to twice daily pantoprazole; begin Dexilant 60 mg daily ?--We reviewed GERD diet and lifestyle modifications today; handout given; I recommended that she avoid eating late and try to avoid lying down for bed on average at least 2 hours after liquids and 4 hours after solid food ? ?2.  Family history of colon cancer and personal history of colon polyps --colonoscopy recently with removal of tubular adenoma and sessile serrated colon polyps ?--Surveillance colonoscopy to be considered around January 2026 ? ?30 minutes total spent today including patient facing time, coordination of care, reviewing medical history/procedures/pertinent radiology studies, and documentation of the encounter. ? ? ?

## 2022-01-10 ENCOUNTER — Other Ambulatory Visit: Payer: Self-pay | Admitting: Internal Medicine

## 2022-01-10 NOTE — Telephone Encounter (Signed)
Prior authorization has been started 

## 2022-01-11 ENCOUNTER — Telehealth: Payer: Self-pay | Admitting: *Deleted

## 2022-01-11 NOTE — Telephone Encounter (Signed)
We have received a fax from [employer] indicating that [place] has been denied for this patient.  ? ?"The policy states that this medication may be approved when: ?-The member has a clinical condition or needs a specific dosage form for which there is no alternative. ?-The listed formulary alternatives are not recommended based on published guidelines or clinical literature OR ?-The formulary alternatives will likely be ineffective ro less effective for the member OR ?-The formulary alternatives will likely cause an adverse effect OR ?-The member is unable to take the required number of formulary alternatives for the given diagnosis due to a trial and inadequate treatment response or contraindication OR ?-The member has tried and failed the required number of formulary alternatives... ? ?Formulary alternatives are esomeprazole capsule, lansoprazole capsule, omeprazole capsule, pantoprazole tablet, rabeprazole tablet. REQUIREMENT: 3 in a class with 3 or more alternatives..." ? ? ?OF NOTE: Pantoprazole ws ineffective for patient. Dexilant not approved. ? ?Dr Hilarie Fredrickson, do you have a preference for what PPI we try since pantoprazole was ineffective?  ? ?

## 2022-01-13 NOTE — Telephone Encounter (Signed)
She has tried omeprazole and pantoprazole ?Can try lansoprazole 30 mg BID-AC ?We need to let pt know this is BID as opposed to dexilant which would have been once daily ?If this lansoprazole isnt effective she needs to let me know, meaning if she has breakthrough or uncontrolled heartburn, throat clearing, cough ?JMP ? ?

## 2022-01-15 ENCOUNTER — Other Ambulatory Visit: Payer: Self-pay | Admitting: Internal Medicine

## 2022-01-15 MED ORDER — LANSOPRAZOLE 30 MG PO CPDR: 30.0000 mg | DELAYED_RELEASE_CAPSULE | Freq: Two times a day (BID) | ORAL | 2 refills | Status: DC

## 2022-01-15 NOTE — Telephone Encounter (Signed)
I have spoken to patient's son (patient speaks french only). He is advised that insurance will not cover [place]. Instead, we will send twice daily lansoprazole for the patient to take 30 minutes before breakfast and dinner. Son verbalizes understanding. Rx sent. ?

## 2022-01-15 NOTE — Telephone Encounter (Signed)
Patient is returning your call.  

## 2022-01-15 NOTE — Telephone Encounter (Signed)
I have left a message for patient to call back. 

## 2022-01-16 ENCOUNTER — Encounter: Payer: Self-pay | Admitting: Primary Care

## 2022-01-16 ENCOUNTER — Ambulatory Visit (INDEPENDENT_AMBULATORY_CARE_PROVIDER_SITE_OTHER): Payer: 59 | Admitting: Primary Care

## 2022-01-16 VITALS — BP 116/74 | HR 57 | Ht 64.0 in | Wt 159.4 lb

## 2022-01-16 DIAGNOSIS — Z800 Family history of malignant neoplasm of digestive organs: Secondary | ICD-10-CM

## 2022-01-16 DIAGNOSIS — E785 Hyperlipidemia, unspecified: Secondary | ICD-10-CM | POA: Diagnosis not present

## 2022-01-16 DIAGNOSIS — I10 Essential (primary) hypertension: Secondary | ICD-10-CM

## 2022-01-16 DIAGNOSIS — K2100 Gastro-esophageal reflux disease with esophagitis, without bleeding: Secondary | ICD-10-CM | POA: Diagnosis not present

## 2022-01-16 DIAGNOSIS — J3089 Other allergic rhinitis: Secondary | ICD-10-CM

## 2022-01-16 DIAGNOSIS — R69 Illness, unspecified: Secondary | ICD-10-CM | POA: Diagnosis not present

## 2022-01-16 DIAGNOSIS — K219 Gastro-esophageal reflux disease without esophagitis: Secondary | ICD-10-CM | POA: Insufficient documentation

## 2022-01-16 DIAGNOSIS — I25118 Atherosclerotic heart disease of native coronary artery with other forms of angina pectoris: Secondary | ICD-10-CM | POA: Diagnosis not present

## 2022-01-16 DIAGNOSIS — E039 Hypothyroidism, unspecified: Secondary | ICD-10-CM

## 2022-01-16 DIAGNOSIS — F411 Generalized anxiety disorder: Secondary | ICD-10-CM

## 2022-01-16 MED ORDER — FLUTICASONE PROPIONATE 50 MCG/ACT NA SUSP: 1.0000 | Freq: Two times a day (BID) | NASAL | 5 refills | Status: DC

## 2022-01-16 MED ORDER — CARBINOXAMINE MALEATE 4 MG PO TABS: 1.0000 | ORAL_TABLET | Freq: Two times a day (BID) | ORAL | 0 refills | Status: DC

## 2022-01-16 NOTE — Assessment & Plan Note (Signed)
Colonoscopy up-to-date. ?Recall due in 2026. ?

## 2022-01-16 NOTE — Assessment & Plan Note (Signed)
Asymptomatic. ?Following with cardiology, office notes from March 2023 reviewed. ? ?Continue isosorbide mononitrate 15 mg daily, aspirin 81 mg daily, blood pressure and lipid control. ?

## 2022-01-16 NOTE — Assessment & Plan Note (Signed)
Controlled.  Continue atorvastatin 20 mg daily. 

## 2022-01-16 NOTE — Assessment & Plan Note (Signed)
Following with GI, office notes reviewed from March 2023. ? ?Continue pantoprazole 20 mg twice daily. ? ? ?

## 2022-01-16 NOTE — Patient Instructions (Signed)
Pour les allergies : Commencer la carbinoxamine 4 mg. Prendre 1 comprim? par voie orale deux fois par jour. ? ?Ne prenez pas Xyzal pour les allergies. ? ?Utilisez le spray nasal Flonase au besoin. ? ?Planifiez une visite de suivi pour d?cembre de cette ann?e. ? ?Content de te voir! ? ?For allergies: Start carbinoxamine 4 mg. Take 1 tablet by mouth twice daily. ? ?Do not take Xyzal for allergies. ? ?Use the Flonase nasal spray as needed. ? ?Schedule a follow up visit for December this year. ? ?Great to see you! ?

## 2022-01-16 NOTE — Progress Notes (Signed)
? ?Subjective:  ? ? Patient ID: Bethany Sharp, female    DOB: 05-May-1946, 76 y.o.   MRN: [id number] ? ?HPI ? ?Bethany Sharp is a very pleasant 76 y.o. female with a history of hypertension, CAD, SVT, OSA, hypothyroidism, GAD, hyperlipidemia who presents today for follow-up of chronic conditions.  She has a language interpreter with her today ? ?1) Hyperlipidemia/CAD/SVT: Currently managed on atorvastatin 20 mg daily, Imdur 15 mg daily.  Following with cardiology, last visit was late March 2023, no changes made to her regimen. ? ?She denies chest pain, dizziness, shortness of breath. ? ?2) GERD with esophagitis: With nonobstructing Schatzki's ring, gastritis with erosions, history of colon polyps with family history of colon cancer, diverticulosis.  ? ?Follows with GI. Last office visit was last week.  She underwent upper endoscopy and colonoscopy in January 2023.  Results positive for esophagitis and gastritis, colonic polyps were removed.  Previously managed on pantoprazole 40 mg twice daily, now managed on Dexilant 60 mg daily given recurrent cough and throat clearing.  She is due for repeat colonoscopy in January 2026. ? ?Today she mentions that she cannot afford the prescribed GERD treatment, so she is now back to taking pantoprazole 20 mg twice daily.  Overall she has noticed some improvement. ? ?3) Hypothyroidism: Currently managed on levothyroxine 50 mcg.  She takes her levothyroxine every morning with breakfast. She has been doing this for the last 10 years.  TSH completed in December 2022 which was 0.37. ? ?4) GAD: Currently managed on sertraline 50 mg daily.  Feels well managed on this regimen. ? ?5) Chronic Allergies: Chronic for years. Symptoms include ear fullness, post nasal drip, cough.  She is also following with GI for chronic reflux.  She is failed numerous OTC allergy regimens including Xyzal, Zyrtec, Claritin.  She is currently taking Xyzal and has not noticed improvement. ? ?Review of Systems   ?HENT:  Positive for postnasal drip.   ?     Ear fullness  ?Respiratory:  Positive for cough. Negative for shortness of breath.   ?Cardiovascular:  Negative for chest pain.  ?Allergic/Immunologic: Positive for environmental allergies.  ?Neurological:  Negative for dizziness and headaches.  ?Psychiatric/Behavioral:  The patient is not nervous/anxious.   ? ?   ? ? ?Past Medical History:  ?Diagnosis Date  ? Allergies   ? Anal fissure   ? Anxiety   ? Arthritis   ? CAD (coronary artery disease)   ? Clotting disorder ([place])   ? Colon polyps   ? Diverticulosis   ? GERD (gastroesophageal reflux disease)   ? HTN (hypertension)   ? Hyperlipidemia   ? Internal hemorrhoids   ? Sleep apnea   ? wears c-pap  ? Sleep apnea with use of continuous positive airway pressure (CPAP)   ? Thyroid disease   ? hypothyroidism  ? Tubular adenoma of colon   ? ? ?Social History  ? ?Socioeconomic History  ? Marital status: Divorced  ?  Spouse name: Not on file  ? Number of children: 1  ? Years of education: Not on file  ? Highest education level: Not on file  ?Occupational History  ? Not on file  ?Tobacco Use  ? Smoking status: Every Day  ?  Packs/day: 0.25  ?  Years: 57.00  ?  Pack years: 14.25  ?  Types: Cigarettes  ?  Start date: 1962  ? Smokeless tobacco: Never  ? Tobacco comments:  ?  smokes about 4 cigs a day  ?  Vaping Use  ? Vaping Use: Never used  ?Substance and Sexual Activity  ? Alcohol use: Yes  ?  Comment: wine 2 glasses per day  ? Drug use: Never  ? Sexual activity: Not on file  ?Other Topics Concern  ? Not on file  ?Social History Narrative  ? Not on file  ? ?Social Determinants of Health  ? ?Financial Resource Strain: Not on file  ?Food Insecurity: Not on file  ?Transportation Needs: Not on file  ?Physical Activity: Not on file  ?Stress: Not on file  ?Social Connections: Not on file  ?Intimate Partner Violence: Not on file  ? ? ?Past Surgical History:  ?Procedure Laterality Date  ? APPENDECTOMY  1960  ? CESAREAN SECTION    ?  COLONOSCOPY    ? tummy tuck    ? ? ?Family History  ?Problem Relation Age of Onset  ? Coronary artery disease Mother   ? Colon cancer Sister   ? Esophageal cancer Neg Hx   ? Rectal cancer Neg Hx   ? Stomach cancer Neg Hx   ? Pancreatic cancer Neg Hx   ? ? ?No Known Allergies ? ?Current Outpatient Medications on File Prior to Visit  ?Medication Sig Dispense Refill  ? aspirin EC 81 MG tablet Take 1 tablet (81 mg total) by mouth daily. Swallow whole. 90 tablet 1  ? atorvastatin (LIPITOR) 20 MG tablet Take 1 tablet (20 mg total) by mouth daily. 90 tablet 3  ? irbesartan (AVAPRO) 75 MG tablet Take 1 tablet (75 mg total) by mouth daily. For blood pressure. 90 tablet 3  ? isosorbide mononitrate (IMDUR) 30 MG 24 hr tablet Take 0.5 tablets (15 mg total) by mouth daily. 45 tablet 3  ? lansoprazole (PREVACID) 30 MG capsule Take 1 capsule (30 mg total) by mouth 2 (two) times daily before a meal. Pharmacy-please d/c rx for dexilant. Insurance will not cover. 60 capsule 2  ? levocetirizine (XYZAL) 5 MG tablet TAKE 1 TABLET BY MOUTH EVERY DAY IN THE EVENING FOR ALLERGIES 90 tablet 1  ? levothyroxine (SYNTHROID) 50 MCG tablet Take 1 tablet by mouth every morning on an empty stomach with water only.  No food or other medications for 30 minutes. 90 tablet 3  ? pantoprazole (PROTONIX) 20 MG tablet Take 20 mg by mouth daily.    ? sertraline (ZOLOFT) 50 MG tablet Take 1 tablet (50 mg total) by mouth daily. For anxiety. 90 tablet 3  ? ?No current facility-administered medications on file prior to visit.  ? ? ?BP 116/74   Pulse (!) 57   Ht '5\' 4"'$  (1.626 m)   Wt 159 lb 6.4 oz (72.3 kg)   SpO2 96%   BMI 27.36 kg/m?  ?Objective:  ? Physical Exam ?Cardiovascular:  ?   Rate and Rhythm: Normal rate and regular rhythm.  ?Pulmonary:  ?   Effort: Pulmonary effort is normal.  ?   Breath sounds: Normal breath sounds.  ?Musculoskeletal:  ?   Cervical back: Neck supple.  ?Skin: ?   General: Skin is warm and dry.  ?Psychiatric:     ?   Mood and  Affect: Mood normal.  ? ? ? ? ? ?   ?Assessment & Plan:  ? ? ? ? ?This visit occurred during the SARS-CoV-2 public health emergency.  Safety protocols were in place, including screening questions prior to the visit, additional usage of staff PPE, and extensive cleaning of exam room while observing appropriate contact time as indicated for  disinfecting solutions.  ?

## 2022-01-16 NOTE — Assessment & Plan Note (Signed)
Controlled. ? ?Continue levothyroxine 50 mcg daily. ?

## 2022-01-16 NOTE — Assessment & Plan Note (Signed)
Failed numerous OTC antihistamines. ? ?Stop Xyzal. ? ?Prescription for carbinoxamine 4 mg twice daily sent to pharmacy.  Drowsiness precautions provided. ? ?Refills provided for Flonase nasal spray. ? ?She will update if no improvement. ?

## 2022-01-16 NOTE — Assessment & Plan Note (Signed)
Controlled.  Continue sertraline 50 mg daily. 

## 2022-01-16 NOTE — Assessment & Plan Note (Addendum)
Controlled. ? ?Continue irbesartan 75 mg daily. ?CMP from December 2022 reviewed. ?

## 2022-01-19 DIAGNOSIS — G4733 Obstructive sleep apnea (adult) (pediatric): Secondary | ICD-10-CM | POA: Diagnosis not present

## 2022-01-31 NOTE — Addendum Note (Signed)
Addended by: Janan Halter F on: 01/31/2022 04:26 PM ? ? Modules accepted: Orders ? ?

## 2022-02-02 ENCOUNTER — Other Ambulatory Visit: Payer: Self-pay | Admitting: Primary Care

## 2022-02-02 DIAGNOSIS — F411 Generalized anxiety disorder: Secondary | ICD-10-CM

## 2022-02-02 NOTE — Telephone Encounter (Signed)
Patient has refills for Zoloft at pharmacy. ?Have her contact pharmacy for refills.  ?

## 2022-02-08 NOTE — Telephone Encounter (Signed)
Patient's son informed that they should contact the pharmacy for Sertraline refills as they have some available there already.  ?

## 2022-02-18 DIAGNOSIS — G4733 Obstructive sleep apnea (adult) (pediatric): Secondary | ICD-10-CM | POA: Diagnosis not present

## 2022-02-23 ENCOUNTER — Emergency Department (HOSPITAL_BASED_OUTPATIENT_CLINIC_OR_DEPARTMENT_OTHER): Payer: 59 | Admitting: Radiology

## 2022-02-23 ENCOUNTER — Encounter (HOSPITAL_BASED_OUTPATIENT_CLINIC_OR_DEPARTMENT_OTHER): Payer: Self-pay

## 2022-02-23 ENCOUNTER — Emergency Department (HOSPITAL_BASED_OUTPATIENT_CLINIC_OR_DEPARTMENT_OTHER)
Admission: EM | Admit: 2022-02-23 | Discharge: 2022-02-24 | Disposition: A | Discharge status: Home / Self Care | Payer: 59 | Attending: Emergency Medicine | Admitting: Emergency Medicine

## 2022-02-23 ENCOUNTER — Other Ambulatory Visit: Payer: Self-pay

## 2022-02-23 DIAGNOSIS — S24109A Unspecified injury at unspecified level of thoracic spinal cord, initial encounter: Secondary | ICD-10-CM | POA: Diagnosis present

## 2022-02-23 DIAGNOSIS — M25532 Pain in left wrist: Secondary | ICD-10-CM | POA: Insufficient documentation

## 2022-02-23 DIAGNOSIS — Y92009 Unspecified place in unspecified non-institutional (private) residence as the place of occurrence of the external cause: Secondary | ICD-10-CM | POA: Diagnosis not present

## 2022-02-23 DIAGNOSIS — S6292XA Unspecified fracture of left wrist and hand, initial encounter for closed fracture: Secondary | ICD-10-CM | POA: Diagnosis not present

## 2022-02-23 DIAGNOSIS — W108XXA Fall (on) (from) other stairs and steps, initial encounter: Secondary | ICD-10-CM | POA: Insufficient documentation

## 2022-02-23 DIAGNOSIS — Z7982 Long term (current) use of aspirin: Secondary | ICD-10-CM | POA: Insufficient documentation

## 2022-02-23 DIAGNOSIS — I2510 Atherosclerotic heart disease of native coronary artery without angina pectoris: Secondary | ICD-10-CM | POA: Diagnosis not present

## 2022-02-23 DIAGNOSIS — I10 Essential (primary) hypertension: Secondary | ICD-10-CM | POA: Diagnosis not present

## 2022-02-23 DIAGNOSIS — S22000A Wedge compression fracture of unspecified thoracic vertebra, initial encounter for closed fracture: Secondary | ICD-10-CM | POA: Insufficient documentation

## 2022-02-23 DIAGNOSIS — S22080A Wedge compression fracture of T11-T12 vertebra, initial encounter for closed fracture: Secondary | ICD-10-CM | POA: Diagnosis not present

## 2022-02-23 DIAGNOSIS — E039 Hypothyroidism, unspecified: Secondary | ICD-10-CM | POA: Insufficient documentation

## 2022-02-23 DIAGNOSIS — S62102A Fracture of unspecified carpal bone, left wrist, initial encounter for closed fracture: Secondary | ICD-10-CM

## 2022-02-23 DIAGNOSIS — Z79899 Other long term (current) drug therapy: Secondary | ICD-10-CM | POA: Insufficient documentation

## 2022-02-23 DIAGNOSIS — M545 Low back pain, unspecified: Secondary | ICD-10-CM | POA: Diagnosis not present

## 2022-02-23 DIAGNOSIS — W19XXXA Unspecified fall, initial encounter: Secondary | ICD-10-CM

## 2022-02-23 MED ORDER — FENTANYL CITRATE PF 50 MCG/ML IJ SOSY
50.0000 ug | PREFILLED_SYRINGE | Freq: Once | INTRAMUSCULAR | Status: AC
  Administered 2022-02-24: 50 ug via INTRAMUSCULAR
  Filled 2022-02-23: qty 1

## 2022-02-23 MED ORDER — OXYCODONE-ACETAMINOPHEN 5-325 MG PO TABS
1.0000 | ORAL_TABLET | Freq: Once | ORAL | Status: AC
  Administered 2022-02-24: 1 via ORAL
  Filled 2022-02-23: qty 1

## 2022-02-23 MED ORDER — FENTANYL CITRATE PF 50 MCG/ML IJ SOSY: 50.0000 ug | PREFILLED_SYRINGE | Freq: Once | INTRAMUSCULAR | Status: DC

## 2022-02-23 MED ORDER — METHOCARBAMOL 500 MG PO TABS
1000.0000 mg | ORAL_TABLET | Freq: Once | ORAL | Status: AC
  Administered 2022-02-24: 1000 mg via ORAL
  Filled 2022-02-23: qty 2

## 2022-02-23 NOTE — ED Provider Triage Note (Signed)
Emergency Medicine Provider Triage Evaluation Note ? ?Bethany Sharp , a 76 y.o. female  was evaluated in triage.  Pt complains of wrist pain and back pain following a fall on her deck around 6:00 tonight.  Patient slipped and fell backwards, she was holding a leaf blower, which fell on top of her left wrist and crushed.  Denies numbness and tingling of the extremity.  Still has range of motion in the fingers.  States that she feels anxious.  History of anxiety, takes Zoloft for this.  Denies hitting head or losing consciousness.  Not on anticoagulation. ? ?Son provides translation. ? ?Review of Systems  ?Positive: As above ?Negative: As above ? ?Physical Exam  ?BP (!) 150/80   Pulse 70   Temp 98.2 ?F (36.8 ?C)   Resp 18   Ht '5\' 4"'$  (1.626 m)   Wt 70.3 kg   SpO2 100%   BMI 26.61 kg/m?  ?Gen:   Awake, no distress, anxious appearing ?Resp:  Normal effort, CTAB ?MSK:   Moves extremities without difficulty with the exception of her left wrist, which appears swollen, edematous, tender with visible ecchymosis and obvious deformity.  Tenderness of the thoracic spine, range of motion not assessed due to imaging results of possible compression fraction.  2+ radial pulses bilaterally.  Sensation appears grossly intact of bilateral upper extremities. ? ?Medical Decision Making  ?Medically screening exam initiated at 11:17 PM.  Appropriate orders placed.  Bethany Sharp was informed that the remainder of the evaluation will be completed by another provider, this initial triage assessment does not replace that evaluation, and the importance of remaining in the ED until their evaluation is complete. ? ?Imaging and fentanyl ordered. ?  ?Prince Rome, PA-C ?56/43/32 2321 ? ?

## 2022-02-23 NOTE — ED Triage Notes (Signed)
Arrives POV after a fall at home in the garden. C/o midline lower back pain and left wrist pain/ swelling. Denies LOC of head injury.  ?

## 2022-02-24 DIAGNOSIS — S6292XA Unspecified fracture of left wrist and hand, initial encounter for closed fracture: Secondary | ICD-10-CM | POA: Diagnosis not present

## 2022-02-24 LAB — CBG MONITORING, ED: Glucose-Capillary: 148 mg/dL — ABNORMAL HIGH (ref 70–99)

## 2022-02-24 MED ORDER — ONDANSETRON 4 MG PO TBDP
ORAL_TABLET | ORAL | Status: AC
  Administered 2022-02-24: 4 mg via ORAL
  Filled 2022-02-24: qty 1

## 2022-02-24 MED ORDER — METHOCARBAMOL 500 MG PO TABS: 500.0000 mg | ORAL_TABLET | Freq: Two times a day (BID) | ORAL | 0 refills | Status: DC

## 2022-02-24 MED ORDER — ONDANSETRON 4 MG PO TBDP
4.0000 mg | ORAL_TABLET | Freq: Once | ORAL | Status: AC
Start: 2022-02-24 — End: 2022-02-24
  Filled 2022-02-24: qty 1

## 2022-02-24 MED ORDER — OXYCODONE HCL 5 MG PO TABS: 2.5000 mg | ORAL_TABLET | Freq: Four times a day (QID) | ORAL | 0 refills | Status: AC | PRN

## 2022-02-24 NOTE — ED Provider Notes (Signed)
?[facility] ?Provider Note ? ?CSN: [id number] ?Arrival date & time: 02/23/22 1915 ? ?Chief Complaint(s) ?Fall ? ?HPI ?Bethany Sharp is a 76 y.o. female with a past medical history listed below who presents to the emergency department after mechanical fall at home.  Patient was blowing leaves in the yard when she tripped on stairs causing her to fall backwards onto the deck.  She felt immediate pain in her back as well as her left wrist.  Pain is worse with movement and range of motion.  She has swelling to the left wrist.  Back pain is mostly lower back.  She denied any head trauma or loss of consciousness.  Denies any headache, chest pain, abdominal pain, hip pain or lower extremity pain. ? ? ?Fall ? ? ?Past Medical History ?Past Medical History:  ?Diagnosis Date  ? Allergies   ? Anal fissure   ? Anxiety   ? Arthritis   ? CAD (coronary artery disease)   ? Clotting disorder ([place])   ? Colon polyps   ? Diverticulosis   ? GERD (gastroesophageal reflux disease)   ? HTN (hypertension)   ? Hyperlipidemia   ? Internal hemorrhoids   ? Sleep apnea   ? wears c-pap  ? Sleep apnea with use of continuous positive airway pressure (CPAP)   ? Thyroid disease   ? hypothyroidism  ? Tubular adenoma of colon   ? ?Patient Active Problem List  ? Diagnosis Date Noted  ? GERD (gastroesophageal reflux disease) 01/16/2022  ? OSA (obstructive sleep apnea) 07/23/2021  ? Hyperlipidemia 03/29/2021  ? Coronary artery disease of native artery of native heart with stable angina pectoris ([place]) 01/18/2021  ? SVT (supraventricular tachycardia) ([place]) 01/18/2021  ? Tobacco abuse 01/18/2021  ? Lung nodule 12/07/2020  ? Family history of aortic dissection 12/07/2020  ? Family history of colon cancer 12/07/2020  ? Sensation of fullness in both ears 12/07/2020  ? Environmental and seasonal allergies 12/07/2020  ? Essential hypertension 05/26/2019  ? GAD (generalized anxiety disorder) 05/26/2019  ? Hypothyroidism 05/26/2019  ?  Chronic foot pain 05/26/2019  ? ?Home Medication(s) ?Prior to Admission medications   ?Medication Sig Start Date End Date Taking? Authorizing Provider  ?methocarbamol (ROBAXIN) 500 MG tablet Take 1 tablet (500 mg total) by mouth 2 (two) times daily. 02/24/22  Yes Abbrielle Batts, Grayce Sessions, MD  ?oxyCODONE (ROXICODONE) 5 MG immediate release tablet Take 0.5-1 tablets (2.5-5 mg total) by mouth every 6 (six) hours as needed for up to 5 days for severe pain. 02/24/22 03/01/22 Yes Jessabelle Markiewicz, Grayce Sessions, MD  ?aspirin EC 81 MG tablet Take 1 tablet (81 mg total) by mouth daily. Swallow whole. 11/07/21   Werner Lean, MD  ?atorvastatin (LIPITOR) 20 MG tablet Take 1 tablet (20 mg total) by mouth daily. 05/09/21   Werner Lean, MD  ?Carbinoxamine Maleate 4 MG TABS Take 1 tablet (4 mg total) by mouth 2 (two) times daily. For allergies. 01/16/22   [name], NP  ?fluticasone (FLONASE) 50 MCG/ACT nasal spray Place 1 spray into both nostrils 2 (two) times daily. 01/16/22   [name], NP  ?irbesartan (AVAPRO) 75 MG tablet Take 1 tablet (75 mg total) by mouth daily. For blood pressure. 07/05/21   [name], NP  ?isosorbide mononitrate (IMDUR) 30 MG 24 hr tablet Take 0.5 tablets (15 mg total) by mouth daily. 05/07/21   Werner Lean, MD  ?levothyroxine (SYNTHROID) 50 MCG tablet Take 1 tablet by mouth every morning  on an empty stomach with water only.  No food or other medications for 30 minutes. 07/05/21   [name], NP  ?pantoprazole (PROTONIX) 20 MG tablet Take 20 mg by mouth 2 (two) times daily.    [provider]  ?sertraline (ZOLOFT) 50 MG tablet Take 1 tablet (50 mg total) by mouth daily. For anxiety. 07/05/21   [name], NP  ?                                                                                                                                  ?Allergies ?Patient has no known allergies. ? ?Review of Systems ?Review of Systems ?As noted in  HPI ? ?Physical Exam ?Vital Signs  ?I have reviewed the triage vital signs ?BP 130/66 (BP Location: Right Arm)   Pulse 70   Temp 98.2 ?F (36.8 ?C)   Resp 20   Ht '5\' 4"'$  (1.626 m)   Wt 70.3 kg   SpO2 95%   BMI 26.61 kg/m?  ? ?Physical Exam ?Constitutional:   ?   General: She is not in acute distress. ?   Appearance: She is well-developed. She is not diaphoretic.  ?HENT:  ?   Head: Normocephalic and atraumatic.  ?   Right Ear: External ear normal.  ?   Left Ear: External ear normal.  ?   Nose: Nose normal.  ?Eyes:  ?   General: No scleral icterus.    ?   Right eye: No discharge.     ?   Left eye: No discharge.  ?   Conjunctiva/sclera: Conjunctivae normal.  ?   Pupils: Pupils are equal, round, and reactive to light.  ?Cardiovascular:  ?   Rate and Rhythm: Normal rate and regular rhythm.  ?   Pulses:     ?     Radial pulses are 2+ on the right side and 2+ on the left side.  ?     Dorsalis pedis pulses are 2+ on the right side and 2+ on the left side.  ?   Heart sounds: Normal heart sounds. No murmur heard. ?  No friction rub. No gallop.  ?Pulmonary:  ?   Effort: Pulmonary effort is normal. No respiratory distress.  ?   Breath sounds: Normal breath sounds. No stridor. No wheezing.  ?Abdominal:  ?   General: There is no distension.  ?   Palpations: Abdomen is soft.  ?   Tenderness: There is no abdominal tenderness.  ?Musculoskeletal:  ?   Left upper arm: No bony tenderness.  ?   Left elbow: No tenderness.  ?   Left wrist: Swelling, tenderness and bony tenderness present. No lacerations or snuff box tenderness. Decreased range of motion. Normal pulse.  ?   Cervical back: Normal range of motion and neck supple. No bony tenderness.  ?   Thoracic back: No bony tenderness.  ?   Lumbar back: Tenderness  present. No bony tenderness.  ?     Back: ? ?   Comments: Clavicles stable. ?Chest stable to AP/Lat compression. ?Pelvis stable to Lat compression. ?No chest or abdominal wall contusion.  ?Skin: ?   General: Skin is warm  and dry.  ?   Findings: No erythema or rash.  ?Neurological:  ?   Mental Status: She is alert and oriented to person, place, and time.  ?   Comments: Moving all extremities  ? ? ?ED Results and Treatments ?Labs ?(all labs ordered are listed, but only abnormal results are displayed) ?Labs Reviewed  ?CBG MONITORING, ED - Abnormal; Notable for the following components:  ?    Result Value  ? Glucose-Capillary 148 (*)   ? All other components within normal limits  ?                                                                                                                       ?EKG ? EKG Interpretation ? ?Date/Time:    ?Ventricular Rate:    ?PR Interval:    ?QRS Duration:   ?QT Interval:    ?QTC Calculation:   ?R Axis:     ?Text Interpretation:   ?  ? ?  ? ?Radiology ?DG Lumbar Spine Complete ? ?Result Date: 02/23/2022 ?CLINICAL DATA:  Recent fall with back pain, initial encounter EXAM: LUMBAR SPINE - COMPLETE 4+ VIEW COMPARISON:  None Available. FINDINGS: Five lumbar type vertebral bodies are well visualized. Vertebral body height is well maintained. At the T12 level however there is a moderate compression deformity with approximately 30% vertebral body height loss identified. Given the recent injury, this is likely acute in nature. No definitive retropulsion is noted. Mild osteophytic changes are seen as well as a chronic appearing L3 compression deformity. Multilevel facet hypertrophic changes are noted as well. No pars defects are seen. IMPRESSION: Acute appearing T12 compression deformity. Chronic appearing L3 compression deformity is noted as well. Multilevel degenerative changes are noted in the lower lumbar spine. Electronically Signed   By: Inez Catalina M.D.   On: 02/23/2022 20:32  ? ?DG Wrist Complete Left ? ?Result Date: 02/23/2022 ?CLINICAL DATA: Recent fall with wrist pain, initial encounter EXAM: LEFT WRIST - COMPLETE 3+ VIEW COMPARISON:  None Available. FINDINGS: There is a comminuted fracture of the  distal radius involving the articular surface with mild impaction and posterior angulation at the fracture site. Additionally avulsion of the ulnar styloid is seen. No other fractures are noted. IMPRESSION: Distal radial a

## 2022-02-24 NOTE — ED Notes (Signed)
RN provided AVS using Teachback Method. Patient verbalizes understanding of Discharge Instructions. Opportunity for Questioning and Answers were provided by RN. Patient Discharged from ED in Wheelchair to Home with Family.  

## 2022-02-24 NOTE — ED Notes (Signed)
Called [employer] for a TLSO brace at 1158pm ?

## 2022-02-24 NOTE — ED Notes (Addendum)
Pt awake and alert lying in bed on L lateral side -- no acute distress.  Market researcher to arrange for TLSO brace to be delivered -- ED tech to apply short arm splint and this nurse now at bedside to administer pain medications (see MAR for med administration) -- Pt primary language is [place] -- [facility] interpreter [phone] utilized to communicate with patient during interaction ?

## 2022-02-24 NOTE — ED Notes (Addendum)
Late entry -- Ortho Tech has arrived with TLSO brace -- this nurse to bedside with ortho tech;   initially utilized [employer] Interpreter service however pt son arrived while brace being applied and took over as Optometrist.  Patient able to transfer from lying to sitting then standing position with 2 person assistance; initially stated she preferred to stand for TLSO brace placement however pt became nauseated and then assisted to chair -- pt also noticed to be pale and clammy; orders to be obtained for anti-emetic and will re-assess vitals and obtain CBG -- Amy, ED tech at bedside with ortho tech while this nurse out of room to notify provider and pull nausea med.  ?

## 2022-02-24 NOTE — Discharge Instructions (Signed)
For pain control you may take at 1000 mg of Tylenol every 8 hours scheduled.  In addition you can take 0.5 to 1 tablet of Oxycodone every 6 hours as needed for pain not controlled with the scheduled Tylenol. ? ?

## 2022-02-25 ENCOUNTER — Ambulatory Visit (INDEPENDENT_AMBULATORY_CARE_PROVIDER_SITE_OTHER): Payer: 59 | Admitting: Orthopaedic Surgery

## 2022-02-25 DIAGNOSIS — S52572A Other intraarticular fracture of lower end of left radius, initial encounter for closed fracture: Secondary | ICD-10-CM | POA: Diagnosis not present

## 2022-02-25 NOTE — Progress Notes (Signed)
? ?                            ? ? ?Chief Complaint: Left wrist pain ?  ? ? ?History of Present Illness:  ? ? ?Bethany Sharp is a 76 y.o. female right-hand-dominant female presents with left wrist pain after fall backwards on her deck.  She presented to emergency room and was found to have a compression fracture as well as a left distal radius fracture.  She denies any numbness in the hand.  She is here today for further assessment.  She is placed in a splint in the emergency room and told to follow-up with orthopedics.  She is here today with her son as well.  They are from [place] originally. ? ? ? ?Surgical History:   ?None ? ?PMH/PSH/Family History/Social History/Meds/Allergies:   ? ?Past Medical History:  ?Diagnosis Date  ? Allergies   ? Anal fissure   ? Anxiety   ? Arthritis   ? CAD (coronary artery disease)   ? Clotting disorder ([place])   ? Colon polyps   ? Diverticulosis   ? GERD (gastroesophageal reflux disease)   ? HTN (hypertension)   ? Hyperlipidemia   ? Internal hemorrhoids   ? Sleep apnea   ? wears c-pap  ? Sleep apnea with use of continuous positive airway pressure (CPAP)   ? Thyroid disease   ? hypothyroidism  ? Tubular adenoma of colon   ? ?Past Surgical History:  ?Procedure Laterality Date  ? APPENDECTOMY  1960  ? CESAREAN SECTION    ? COLONOSCOPY    ? tummy tuck    ? ?Social History  ? ?Socioeconomic History  ? Marital status: Divorced  ?  Spouse name: Not on file  ? Number of children: 1  ? Years of education: Not on file  ? Highest education level: Not on file  ?Occupational History  ? Not on file  ?Tobacco Use  ? Smoking status: Every Day  ?  Packs/day: 0.25  ?  Years: 57.00  ?  Pack years: 14.25  ?  Types: Cigarettes  ?  Start date: 1962  ? Smokeless tobacco: Never  ? Tobacco comments:  ?  smokes about 4 cigs a day  ?Vaping Use  ? Vaping Use: Never used  ?Substance and Sexual Activity  ? Alcohol use: Yes  ?  Comment: wine 2 glasses per day  ? Drug use: Never  ? Sexual activity: Not on file   ?Other Topics Concern  ? Not on file  ?Social History Narrative  ? Not on file  ? ?Social Determinants of Health  ? ?Financial Resource Strain: Not on file  ?Food Insecurity: Not on file  ?Transportation Needs: Not on file  ?Physical Activity: Not on file  ?Stress: Not on file  ?Social Connections: Not on file  ? ?Family History  ?Problem Relation Age of Onset  ? Coronary artery disease Mother   ? Colon cancer Sister   ? Esophageal cancer Neg Hx   ? Rectal cancer Neg Hx   ? Stomach cancer Neg Hx   ? Pancreatic cancer Neg Hx   ? ?No Known Allergies ?Current Outpatient Medications  ?Medication Sig Dispense Refill  ? aspirin EC 81 MG tablet Take 1 tablet (81 mg total) by mouth daily. Swallow whole. 90 tablet 1  ? atorvastatin (LIPITOR) 20 MG tablet Take 1 tablet (20 mg total) by mouth daily. 90 tablet 3  ?  Carbinoxamine Maleate 4 MG TABS Take 1 tablet (4 mg total) by mouth 2 (two) times daily. For allergies. 180 tablet 0  ? fluticasone (FLONASE) 50 MCG/ACT nasal spray Place 1 spray into both nostrils 2 (two) times daily. 16 g 5  ? irbesartan (AVAPRO) 75 MG tablet Take 1 tablet (75 mg total) by mouth daily. For blood pressure. 90 tablet 3  ? isosorbide mononitrate (IMDUR) 30 MG 24 hr tablet Take 0.5 tablets (15 mg total) by mouth daily. 45 tablet 3  ? levothyroxine (SYNTHROID) 50 MCG tablet Take 1 tablet by mouth every morning on an empty stomach with water only.  No food or other medications for 30 minutes. 90 tablet 3  ? methocarbamol (ROBAXIN) 500 MG tablet Take 1 tablet (500 mg total) by mouth 2 (two) times daily. 20 tablet 0  ? oxyCODONE (ROXICODONE) 5 MG immediate release tablet Take 0.5-1 tablets (2.5-5 mg total) by mouth every 6 (six) hours as needed for up to 5 days for severe pain. 20 tablet 0  ? pantoprazole (PROTONIX) 20 MG tablet Take 20 mg by mouth 2 (two) times daily.    ? sertraline (ZOLOFT) 50 MG tablet Take 1 tablet (50 mg total) by mouth daily. For anxiety. 90 tablet 3  ? ?No current  facility-administered medications for this visit.  ? ?DG Lumbar Spine Complete ? ?Result Date: 02/23/2022 ?CLINICAL DATA:  Recent fall with back pain, initial encounter EXAM: LUMBAR SPINE - COMPLETE 4+ VIEW COMPARISON:  None Available. FINDINGS: Five lumbar type vertebral bodies are well visualized. Vertebral body height is well maintained. At the T12 level however there is a moderate compression deformity with approximately 30% vertebral body height loss identified. Given the recent injury, this is likely acute in nature. No definitive retropulsion is noted. Mild osteophytic changes are seen as well as a chronic appearing L3 compression deformity. Multilevel facet hypertrophic changes are noted as well. No pars defects are seen. IMPRESSION: Acute appearing T12 compression deformity. Chronic appearing L3 compression deformity is noted as well. Multilevel degenerative changes are noted in the lower lumbar spine. Electronically Signed   By: Inez Catalina M.D.   On: 02/23/2022 20:32  ? ?DG Wrist Complete Left ? ?Result Date: 02/23/2022 ?CLINICAL DATA: Recent fall with wrist pain, initial encounter EXAM: LEFT WRIST - COMPLETE 3+ VIEW COMPARISON:  None Available. FINDINGS: There is a comminuted fracture of the distal radius involving the articular surface with mild impaction and posterior angulation at the fracture site. Additionally avulsion of the ulnar styloid is seen. No other fractures are noted. IMPRESSION: Distal radial and ulnar fractures as described. Electronically Signed   By: Inez Catalina M.D.   On: 02/23/2022 20:26   ? ?Review of Systems:   ?A ROS was performed including pertinent positives and negatives as documented in the HPI. ? ?Physical Exam :   ?Constitutional: NAD and appears stated age ?Neurological: Alert and oriented ?Psych: Appropriate affect and cooperative ?There were no vitals taken for this visit.  ? ?Comprehensive Musculoskeletal Exam:   ? ?Splint in place.  She is able to fire anterior  interosseous as well as posterior interosseous nerves.  Sensation intact in all divisions of the left hand.  Fingers warm and well-perfused ? ?Imaging:   ?Xray (3 views left wrist): ?There is a intra-articular distal radius fracture as well as ulnar styloid fracture.  There is acceptable displacement profile about the distal radius with good radial height and inclination there is some dorsal angulation although this is mild ? ? ?  I personally reviewed and interpreted the radiographs. ? ? ?Assessment:   ?76 y.o. female with left distal radius fracture after a fall on her deck.  I did describe that ultimately her displacement profile is quite minimal.  To this effect I believe that she would do quite well with closed management.  At this time I would not plan to take off her splint as she is quite comfortable in this.  I will plan to see her back in 2 weeks and at that time we will transition her into a short arm cast ? ?Plan :   ? ?-Return to clinic in 2 weeks for short arm cast ? ? ? ? ?I personally saw and evaluated the patient, and participated in the management and treatment plan. ? ?Vanetta Mulders, MD ?Attending Physician, Orthopedic Surgery ? ?This document was dictated using Systems analyst. A reasonable attempt at proof reading has been made to minimize errors. ?

## 2022-02-28 ENCOUNTER — Telehealth: Payer: Self-pay | Admitting: Primary Care

## 2022-02-28 NOTE — Telephone Encounter (Signed)
Pts son called and spoke with Minette Brine at front desk pt is almost out of oxycodone and methocarbamol and pts son wants refills. Pt seen [facility] ED on 02/23/22 with fx of wrist and spine; pt saw Dr Vanetta Mulders ortho on 02/25/22. I advised Minette Brine to let pts son know to contact Dr Sammuel Hines office for pain med. Pts son also said pt having abd pain and no BM in 2 days.no available appts at [facility] and Olivia warm transferred pts son to access nurse for triage. Sending note to Allie Bossier NP and [facility] CMA and will attach access note when available.

## 2022-02-28 NOTE — Telephone Encounter (Signed)
[name] Petit (Son) called to state his mom is having abdominal pain and is causing her to not go to the bathroom in the last few days. He wanted to let Bethany Sharp and the nurse know about this. I transferred him to access nurse. She was seen at the ER on 02/23/2022 for a fall.  Callback Number: [phone]

## 2022-02-28 NOTE — Telephone Encounter (Signed)
Per access nurse note pt advice was given and pt already has appt with [name] FNP on 03/01/22 at 10:40. Sending note to T Dugal FNP and Claiborne Billings CMA and will teams [place] also. Please see note below access note about pain med.

## 2022-02-28 NOTE — Telephone Encounter (Signed)
Noted and appreciate Bethany Sharp's evaluation.

## 2022-02-28 NOTE — Telephone Encounter (Signed)
Agree with precautions given to pt  Agree with nurse assessment in plan.  Thank you for speaking with them. 

## 2022-02-28 NOTE — Telephone Encounter (Signed)
Bethany Pancoast, FNP to Me  [name], NP      2:02 PM  If any fever, vomiting, inability to tolerate anything by mouth, or worsening abdominal pain or nausea please go to ER immediately.   Pt could try magnesium citrate to see if bowel relief as well.    [name] (DPR signed) notified as instructed and voiced understanding. [name] said if her abd issues are resolved in the morning [name] will call at 8AM to cancel appt with [name] on 03/01/22. Sending note to [name] FNP.

## 2022-02-28 NOTE — Telephone Encounter (Signed)
[place] Day - Client TELEPHONE ADVICE RECORD [facility] Patient Name: Bethany Sharp Gender: Female DOB: [birth year] Age: 76 Y 1 M 8 D Return Phone Number: [phone] (Primary) Address: City/ State/ Zip: [place]  [id number] [facility] - Client Client Site [facility] - Day Provider Alma Friendly - NP Contact Type Call Who Is Calling Patient / Member / Family / Caregiver Call Type Triage / Clinical Caller Name [name] Petit Relationship To Patient Son Return Phone Number [phone] (Primary) Chief Complaint SEVERE ABDOMINAL PAIN - Severe pain in abdomen Reason for Call Symptomatic / Request for [place] states his mom went to the ER and she has a wrist and spinal fracture and she has severe abdominal pain she can't go to bathroom due to the pain. Translation No Nurse Assessment Nurse: Gildardo Pounds, RN, Amy Date/Time (Eastern Time): 02/28/2022 11:34:49 AM Confirm and document reason for call. If symptomatic, describe symptoms. ---Caller states his mom went to the ER on Sat night. and she has a wrist and spinal fracture. She is having to wear a back brace. She has an appt. to see a spinal surgeon in 2 weeks. She is having abdominal discomfort & can't go to bathroom due to the pain. Her abdomen feels hard & uncomfortable. She has not had a BM in 5 days. She is on Oxycodone '5mg'$ . The prescription says not to use it for more than 5 days. She is also taking Methocarbamol. It makes her sleepy. She wants to know if there is an alternative to these meds. Does the patient have any new or worsening symptoms? ---Yes Will a triage be completed? ---Yes Related visit to physician within the last 2 weeks? ---Yes Does the PT have any chronic conditions? (i.e. diabetes, asthma, this includes High risk factors for pregnancy, etc.) ---Unknown Is this a  behavioral health or substance abuse call? ---No PLEASE NOTE: All timestamps contained within this report are represented as [place] Standard Time. CONFIDENTIALTY NOTICE: This fax transmission is intended only for the addressee. It contains information that is legally privileged, confidential or otherwise protected from use or disclosure. If you are not the intended recipient, you are strictly prohibited from reviewing, disclosing, copying using or disseminating any of this information or taking any action in reliance on or regarding this information. If you have received this fax in error, please notify us immediately by telephone so that we can arrange for its return to [place]. Phone: [phone], Toll-Free: [phone], Fax: [phone] Page: 2 of 2 Call Id: [id number] Guidelines Guideline Title Affirmed Question Affirmed Notes Nurse Date/Time Eilene Ghazi Time) Constipation Last bowel movement (BM) > 4 days ago [place], Therapist, sports, Amy 02/28/2022 11:36:41 AM Disp. Time Eilene Ghazi Time) Disposition Final User 02/28/2022 11:32:51 AM Send to Urgent Queue Benetta Spar 02/28/2022 11:42:08 AM See PCP within 24 Hours Yes [place], RN, Amy Caller Disagree/Comply Comply Caller Understands Yes PreDisposition InappropriateToAsk Care Advice Given Per Guideline SEE PCP WITHIN 24 HOURS: * IF OFFICE WILL BE OPEN: You need to be examined within the next 24 hours. Call your doctor (or NP/PA) when the office opens and make an appointment. STEP 1 - USE AN OSMOTIC LAXATIVE: * You can take POLYETHYLENE GLYCOL (PEG, Miralax) or MILK OF MAGNESIA. * This type of medicine helps pull water into your intestines. This softens the stools. STEP 2 - USE A STIMULANT LAXATIVE - SUPPOSITORY: * If the constipation does not get better with the Care Advice  in Step 1, add a stimulant laxative. * Use either BISACODYL (Dulcolax) or a GLYCERIN SUPPOSITORY. CALL BACK IF: * Severe or increasing abdomen pain * Vomiting occurs * You become  worse CARE ADVICE given per Constipation (Adult) guideline. Referrals REFERRED TO PCP OFFIC

## 2022-03-01 ENCOUNTER — Ambulatory Visit: Payer: 59 | Admitting: Family

## 2022-03-05 ENCOUNTER — Other Ambulatory Visit: Payer: Self-pay | Admitting: Primary Care

## 2022-03-05 DIAGNOSIS — F411 Generalized anxiety disorder: Secondary | ICD-10-CM

## 2022-03-06 NOTE — Telephone Encounter (Signed)
Spoke to son they will call pharmacy for refill.

## 2022-03-06 NOTE — Telephone Encounter (Signed)
Please call patient's son: patient speaks [place]. Received refill request for sertraline(Zoloft) but patient has refills at her pharmacy. Have her request refills through her pharmacy.   We will see her in December as scheduled.

## 2022-03-08 ENCOUNTER — Ambulatory Visit (HOSPITAL_BASED_OUTPATIENT_CLINIC_OR_DEPARTMENT_OTHER): Payer: 59 | Admitting: Orthopaedic Surgery

## 2022-03-09 ENCOUNTER — Other Ambulatory Visit (HOSPITAL_BASED_OUTPATIENT_CLINIC_OR_DEPARTMENT_OTHER): Payer: Self-pay | Admitting: Internal Medicine

## 2022-03-09 ENCOUNTER — Other Ambulatory Visit: Payer: Self-pay | Admitting: Internal Medicine

## 2022-03-09 DIAGNOSIS — K219 Gastro-esophageal reflux disease without esophagitis: Secondary | ICD-10-CM

## 2022-03-15 ENCOUNTER — Ambulatory Visit (INDEPENDENT_AMBULATORY_CARE_PROVIDER_SITE_OTHER): Payer: 59

## 2022-03-15 ENCOUNTER — Ambulatory Visit (HOSPITAL_BASED_OUTPATIENT_CLINIC_OR_DEPARTMENT_OTHER): Payer: 59 | Admitting: Orthopaedic Surgery

## 2022-03-15 DIAGNOSIS — S52572A Other intraarticular fracture of lower end of left radius, initial encounter for closed fracture: Secondary | ICD-10-CM | POA: Diagnosis not present

## 2022-03-15 DIAGNOSIS — S52502A Unspecified fracture of the lower end of left radius, initial encounter for closed fracture: Secondary | ICD-10-CM | POA: Diagnosis not present

## 2022-03-15 NOTE — Progress Notes (Signed)
Chief Complaint: Left wrist pain     History of Present Illness:    Bethany Sharp is a 76 y.o. female right-hand-dominant presents today for follow-up of her left wrist fracture.She is here today for splint removal and replacement into a cast.  She states that overall the left wrist is feeling much better.   Surgical History:   None  PMH/PSH/Family History/Social History/Meds/Allergies:    Past Medical History:  Diagnosis Date   Allergies    Anal fissure    Anxiety    Arthritis    CAD (coronary artery disease)    Clotting disorder (HCC)    Colon polyps    Diverticulosis    GERD (gastroesophageal reflux disease)    HTN (hypertension)    Hyperlipidemia    Internal hemorrhoids    Sleep apnea    wears c-pap   Sleep apnea with use of continuous positive airway pressure (CPAP)    Thyroid disease    hypothyroidism   Tubular adenoma of colon    Past Surgical History:  Procedure Laterality Date   APPENDECTOMY  1960   CESAREAN SECTION     COLONOSCOPY     tummy tuck     Social History   Socioeconomic History   Marital status: Divorced    Spouse name: Not on file   Number of children: 1   Years of education: Not on file   Highest education level: Not on file  Occupational History   Not on file  Tobacco Use   Smoking status: Every Day    Packs/day: 0.25    Years: 57.00    Pack years: 14.25    Types: Cigarettes    Start date: 1962   Smokeless tobacco: Never   Tobacco comments:    smokes about 4 cigs a day  Vaping Use   Vaping Use: Never used  Substance and Sexual Activity   Alcohol use: Yes    Comment: wine 2 glasses per day   Drug use: Never   Sexual activity: Not on file  Other Topics Concern   Not on file  Social History Narrative   Not on file   Social Determinants of Health   Financial Resource Strain: Not on file  Food Insecurity: Not on file  Transportation Needs: Not on file  Physical Activity: Not on file   Stress: Not on file  Social Connections: Not on file   Family History  Problem Relation Age of Onset   Coronary artery disease Mother    Colon cancer Sister    Esophageal cancer Neg Hx    Rectal cancer Neg Hx    Stomach cancer Neg Hx    Pancreatic cancer Neg Hx    No Known Allergies Current Outpatient Medications  Medication Sig Dispense Refill   ASPIRIN LOW DOSE 81 MG tablet TAKE 1 TABLET (81 MG TOTAL) BY MOUTH DAILY. SWALLOW WHOLE. 90 tablet 3   atorvastatin (LIPITOR) 20 MG tablet Take 1 tablet (20 mg total) by mouth daily. 90 tablet 3   Carbinoxamine Maleate 4 MG TABS Take 1 tablet (4 mg total) by mouth 2 (two) times daily. For allergies. 180 tablet 0   fluticasone (FLONASE) 50 MCG/ACT nasal spray Place 1 spray into both nostrils 2 (two) times daily. 16 g 5   irbesartan (AVAPRO) 75 MG tablet Take  1 tablet (75 mg total) by mouth daily. For blood pressure. 90 tablet 3   isosorbide mononitrate (IMDUR) 30 MG 24 hr tablet Take 0.5 tablets (15 mg total) by mouth daily. 45 tablet 3   levothyroxine (SYNTHROID) 50 MCG tablet Take 1 tablet by mouth every morning on an empty stomach with water only.  No food or other medications for 30 minutes. 90 tablet 3   methocarbamol (ROBAXIN) 500 MG tablet Take 1 tablet (500 mg total) by mouth 2 (two) times daily. 20 tablet 0   pantoprazole (PROTONIX) 20 MG tablet Take 20 mg by mouth 2 (two) times daily.     sertraline (ZOLOFT) 50 MG tablet Take 1 tablet (50 mg total) by mouth daily. For anxiety. 90 tablet 3   No current facility-administered medications for this visit.   No results found.  Review of Systems:   A ROS was performed including pertinent positives and negatives as documented in the HPI.  Physical Exam :   Constitutional: NAD and appears stated age Neurological: Alert and oriented Psych: Appropriate affect and cooperative There were no vitals taken for this visit.   Comprehensive Musculoskeletal Exam:    Splint in place.  She is  able to fire anterior interosseous as well as posterior interosseous nerves.  Sensation intact in all divisions of the left hand.  Fingers warm and well-perfused  Imaging:   Xray (3 views left wrist): There is a intra-articular distal radius fracture as well as ulnar styloid fracture.  There is acceptable displacement profile about the distal radius with good radial height and inclination there is some dorsal angulation although this is mild   I personally reviewed and interpreted the radiographs.   Assessment:   76 y.o. female with left distal radius fracture after a fall on her deck.  At today's visit we will plan to transition her to a short arm cast and I will see her back in 3 weeks for cast removal  Plan :    -Return to clinic in 3 weeks for cast removal     I personally saw and evaluated the patient, and participated in the management and treatment plan.  Vanetta Mulders, MD Attending Physician, Orthopedic Surgery  This document was dictated using Dragon voice recognition software. A reasonable attempt at proof reading has been made to minimize errors.

## 2022-03-19 DIAGNOSIS — Z6825 Body mass index (BMI) 25.0-25.9, adult: Secondary | ICD-10-CM | POA: Diagnosis not present

## 2022-03-19 DIAGNOSIS — S22080A Wedge compression fracture of T11-T12 vertebra, initial encounter for closed fracture: Secondary | ICD-10-CM | POA: Diagnosis not present

## 2022-03-21 DIAGNOSIS — G4733 Obstructive sleep apnea (adult) (pediatric): Secondary | ICD-10-CM | POA: Diagnosis not present

## 2022-04-05 ENCOUNTER — Ambulatory Visit (INDEPENDENT_AMBULATORY_CARE_PROVIDER_SITE_OTHER): Payer: 59

## 2022-04-05 ENCOUNTER — Ambulatory Visit (HOSPITAL_BASED_OUTPATIENT_CLINIC_OR_DEPARTMENT_OTHER): Payer: 59 | Admitting: Orthopaedic Surgery

## 2022-04-05 DIAGNOSIS — S52572A Other intraarticular fracture of lower end of left radius, initial encounter for closed fracture: Secondary | ICD-10-CM | POA: Diagnosis not present

## 2022-04-05 DIAGNOSIS — M7989 Other specified soft tissue disorders: Secondary | ICD-10-CM | POA: Diagnosis not present

## 2022-04-06 ENCOUNTER — Other Ambulatory Visit: Payer: Self-pay | Admitting: Primary Care

## 2022-04-06 DIAGNOSIS — F411 Generalized anxiety disorder: Secondary | ICD-10-CM

## 2022-04-08 ENCOUNTER — Other Ambulatory Visit: Payer: Self-pay | Admitting: Internal Medicine

## 2022-04-09 ENCOUNTER — Ambulatory Visit: Payer: 59 | Admitting: Internal Medicine

## 2022-04-09 DIAGNOSIS — S22080A Wedge compression fracture of T11-T12 vertebra, initial encounter for closed fracture: Secondary | ICD-10-CM | POA: Diagnosis not present

## 2022-04-11 ENCOUNTER — Ambulatory Visit: Payer: 59 | Admitting: Rehabilitative and Restorative Service Providers"

## 2022-04-11 ENCOUNTER — Other Ambulatory Visit: Payer: Self-pay

## 2022-04-11 ENCOUNTER — Encounter: Payer: Self-pay | Admitting: Rehabilitative and Restorative Service Providers"

## 2022-04-11 DIAGNOSIS — R600 Localized edema: Secondary | ICD-10-CM

## 2022-04-11 DIAGNOSIS — R278 Other lack of coordination: Secondary | ICD-10-CM | POA: Diagnosis not present

## 2022-04-11 DIAGNOSIS — M6281 Muscle weakness (generalized): Secondary | ICD-10-CM

## 2022-04-11 DIAGNOSIS — M25632 Stiffness of left wrist, not elsewhere classified: Secondary | ICD-10-CM

## 2022-04-11 DIAGNOSIS — M25532 Pain in left wrist: Secondary | ICD-10-CM

## 2022-04-11 NOTE — Therapy (Signed)
OUTPATIENT OCCUPATIONAL THERAPY ORTHO EVALUATION  Patient Name: Bethany Sharp MRN: [id number] DOB:November 07, 1945, 76 y.o., female Today's Date: 04/11/2022  PCP: Alma Friendly, NP REFERRING PROVIDER: Dr. Vanetta Mulders    OT End of Session - 04/11/22 1614     Visit Number 1    Number of Visits 10    Date for OT Re-Evaluation 05/24/22    Authorization Type [employer]    Authorization - Number of Visits 35   combined PT/OT   OT Start Time 9892    OT Stop Time 1656    OT Time Calculation (min) 42 min    Activity Tolerance Patient tolerated treatment well;Patient limited by pain;Patient limited by fatigue    Behavior During Therapy [facility] for tasks assessed/performed             Past Medical History:  Diagnosis Date   Allergies    Anal fissure    Anxiety    Arthritis    CAD (coronary artery disease)    Clotting disorder (HCC)    Colon polyps    Diverticulosis    GERD (gastroesophageal reflux disease)    HTN (hypertension)    Hyperlipidemia    Internal hemorrhoids    Sleep apnea    wears c-pap   Sleep apnea with use of continuous positive airway pressure (CPAP)    Thyroid disease    hypothyroidism   Tubular adenoma of colon    Past Surgical History:  Procedure Laterality Date   APPENDECTOMY  1960   CESAREAN SECTION     COLONOSCOPY     tummy tuck     Patient Active Problem List   Diagnosis Date Noted   GERD (gastroesophageal reflux disease) 01/16/2022   OSA (obstructive sleep apnea) 07/23/2021   Hyperlipidemia 03/29/2021   Coronary artery disease of native artery of native heart with stable angina pectoris ([place]) 01/18/2021   SVT (supraventricular tachycardia) ([place]) 01/18/2021   Tobacco abuse 01/18/2021   Lung nodule 12/07/2020   Family history of aortic dissection 12/07/2020   Family history of colon cancer 12/07/2020   Sensation of fullness in both ears 12/07/2020   Environmental and seasonal allergies 12/07/2020   Essential hypertension 05/26/2019   GAD  (generalized anxiety disorder) 05/26/2019   Hypothyroidism 05/26/2019   Chronic foot pain 05/26/2019    ONSET DATE: 02/23/22 DOI  REFERRING DIAG: J19.417E (ICD-10-CM) - Other closed intra-articular fracture of distal end of left radius, initial encounter  THERAPY DIAG:  Pain in left wrist  Muscle weakness (generalized)  Localized edema  Other lack of coordination  Stiffness of left wrist, not elsewhere classified  Rationale for Evaluation and Treatment Rehabilitation  SUBJECTIVE:   SUBJECTIVE STATEMENT: She is [place] and arrives with son who translates. She has been living alone for 2 years here. He says she had her cast off last week and was given pre-fab wrist brace but she is not wearing it now, she states not wearing at all at home and wearing when tying to do activities. Son states was looking better when cast came off but has gotten worse looking in past week, more red, swollen and irritated.    PERTINENT HISTORY: Per MD: "Hand therapy for mobility and strengthening following casting from L wrist fx"  She broke (impacted) distal radius and ulna styloid avulsion fx, per ED notes. She also has spinal T12 compression fracture and is in TLSO brace.   PRECAUTIONS: Almost 7 weeks since fall and fx now, A/PROM ok now, hand strength ok now, wait 1-2 weeks  for wrist PRE or dynamics, wean orthotic starting at 8 weeks, wait 10-12 weeks for heavy lifting   WEIGHT BEARING RESTRICTIONS Yes NWB in left hand now   PAIN:  Are you having pain? No Rating: 0/10 at rest right now, does hurt with motion, pain was higher when she went swimming this week.   FALLS: Has patient fallen in last 6 months? Yes. Number of falls 2 (this and one other accident in garden)   LIVING ENVIRONMENT: Lives with: lives alone   PLOF: Independent  PATIENT GOALS to heal and have less pain and more ability    OBJECTIVE:    HAND DOMINANCE: Right   ADLs: Overall ADLs: States decreased ability to grab,  hold household objects, pain and inability to open containers, perform all FMS tasks, etc.    FUNCTIONAL OUTCOME MEASURES: Eval: Patient Specific Functional Scale: TBD (TBD, TBD, TBD) (no time in this visit due to language barriers, etc.)   UPPER EXTREMITY ROM    Eval: makes ful but tight fist, can oppose but tight/painful to base of SF. Limited palmar thumb abd compared to right hand. Overt collapse deformity beginning in left thumb from apparent OA and adduction.   Active ROM Right eval Left eval  Wrist flexion 73 19  Wrist extension 68 30  Wrist ulnar deviation    Wrist radial deviation    Wrist pronation 80 31  Wrist supination 65 65  (Blank rows = not tested)   UPPER EXTREMITY MMT:    TBD when appropriate  MMT Left eval  Shoulder flexion   Shoulder abduction   Shoulder adduction   Shoulder extension   Shoulder internal rotation   Shoulder external rotation   Middle trapezius   Lower trapezius   Elbow flexion   Elbow extension   Wrist flexion   Wrist extension   Wrist ulnar deviation   Wrist radial deviation   Wrist pronation   Wrist supination   (Blank rows = not tested)  HAND FUNCTION: Eval: Grip strength Right: TBD lbs, Left: TBD lbs   COORDINATION: Eval: Coordination seems decreased due to fingers and wrist being tight, loss of motion and pain. Details TBD.  Box and Blocks Test: TBD Blocks today (TBD is WFL)  SENSATION: Eval:  Light touch intact today, no c/o numbness   EDEMA:   Eval:  Mildly swollen in left hand and wrist today  COGNITION: Overall cognitive status: WFL for evaluation today   OBSERVATIONS:   Eval: apparent left thumb OA and beginning collapse deformity, some redness and swelling about wrist crease   TODAY'S TREATMENT:  Eval: Due to her increase in swelling, pain, and too much bearing weight and advancing too soon, OT edu for self-care/safety to do no weight bearing for now and to go back to wearing pre-fab brace at all times  except hygiene and exercises ~3-5x day. Son translates and she states understanding. OT provides the following initial HEP to do, demo's for them and she demo's back with only mild increase in pain.   Exercises - Standing Shoulder Flexion Full Range  - 4-5 x daily - 1-2 sets - 10-15 reps - Palm Up / Palm Down  - 3-4 x daily - 1-2 sets - 10-15 reps - Bend and Pull Back Wrist SLOWLY  - 3-4 x daily - 1-2 sets - 10-15 reps - "Windshield Wipers"   - 3-4 x daily - 1-2 sets - 10-15 reps - Tendon Glides  - 3-4 x daily - 3-5 reps - 2-3  seconds hold  OT also edu on "stable-c" program stretches due to apparent Lt hand thumb CMC J OA and pain that has exacerbated since fx.  -Thumb CMC J flex stretch -Thumb CMC J palmar abd stretch -Web space trigger point release  She states these make her thumb feel better.    PATIENT EDUCATION: Education details: See tx section above for details  Person educated: Patient Education method: Verbal Instruction, Teach back, Handouts  Education comprehension: States and demonstrates understanding, Additional Education required    HOME EXERCISE PROGRAM: Access Code: [email or web address] URL: [email or web address] Prepared by: Benito Mccreedy  GOALS: Goals reviewed with patient? Yes   SHORT TERM GOALS: (STG required if POC>30 days)  Pt will obtain protective, custom orthotic. Target date: TBD PRN Goal status: Initial  2.  Pt will demo/state understanding of initial HEP to improve pain levels and prerequisite motion. Target date: 04/26/22 Goal status: INITIAL   LONG TERM GOALS:  Pt will improve functional ability by decreased impairment per PSFS assessment from TBD to TBD or better, for better quality of life. Target date: 05/24/22 Goal status: INITIAL (will be updated when possible)   2.  Pt will improve grip strength in left hand to at least 40# for functional use at home and in IADLs. Target date: 05/24/22 Goal status: INITIAL  3.  Pt will  improve A/ROM in left wrist flex and ext from 19*/30* respectively to at least  50* in both planes, to have functional motion for tasks like reach and grasp.  Target date: 05/24/22 Goal status: INITIAL  4.  Pt will improve strength in left wrist and arm to at least 4+/5 MMT to have increased functional ability to carry out selfcare and higher-level homecare tasks with no difficulty. Target date: 05/24/22 Goal status: INITIAL  5. Pt will state pain in left arthritic thumb 2/10 or less with resistive functional activities.  Target date: 05/24/22 Goal status: INITIAL    ASSESSMENT:  CLINICAL IMPRESSION: Patient is a 76 y.o. female who was seen today for occupational therapy evaluation for healing left wrist fx, pain, decreased ability and also thumb pain and OA symptoms decreasing ability and quality of life. She will greatly benefit from OP OT to improve medical and fnl status.     PERFORMANCE DEFICITS in functional skills including ADLs, IADLs, coordination, dexterity, edema, ROM, strength, pain, fascial restrictions, flexibility, GMC, body mechanics, endurance, decreased knowledge of precautions, and UE functional use, cognitive skills including safety awareness, and psychosocial skills including coping strategies, habits, and routines and behaviors.   IMPAIRMENTS are limiting patient from ADLs, IADLs, rest and sleep, leisure, and social participation.   COMORBIDITIES has co-morbidities such as healing back fracture (in TLSO), chronic foot pain, anxiety, SVT, OSA, GERD and possibly others  that affects occupational performance. Patient will benefit from skilled OT to address above impairments and improve overall function.  MODIFICATION OR ASSISTANCE TO COMPLETE EVALUATION: No modification of tasks or assist necessary to complete an evaluation.  OT OCCUPATIONAL PROFILE AND HISTORY: Problem focused assessment: Including review of records relating to presenting problem.  CLINICAL DECISION  MAKING: Moderate - several treatment options, min-mod task modification necessary  REHAB POTENTIAL: Good  EVALUATION COMPLEXITY: Low      PLAN: OT FREQUENCY: 2x/week (though she is motivated / requesting 1/week for now)   OT DURATION: 6 weeks  PLANNED INTERVENTIONS: self care/ADL training, therapeutic exercise, therapeutic activity, manual therapy, passive range of motion, splinting, ultrasound, fluidotherapy, compression bandaging, moist heat, cryotherapy, contrast bath, patient/family education, and  coping strategies training  RECOMMENDED OTHER SERVICES: She should have an interpreter with her   CONSULTED AND AGREED WITH PLAN OF CARE: Patient and family member/caregiver  PLAN FOR NEXT SESSION: Go over wrist and Stable -C program and upgrade to wrist PROM and hand strength as tolerated   Benito Mccreedy, OTR/L, CHT 04/11/2022, 5:35 PM

## 2022-04-17 ENCOUNTER — Other Ambulatory Visit: Payer: Self-pay | Admitting: Internal Medicine

## 2022-04-20 DIAGNOSIS — G4733 Obstructive sleep apnea (adult) (pediatric): Secondary | ICD-10-CM | POA: Diagnosis not present

## 2022-04-22 ENCOUNTER — Other Ambulatory Visit: Payer: Self-pay | Admitting: Internal Medicine

## 2022-04-22 ENCOUNTER — Encounter: Payer: Self-pay | Admitting: Rehabilitative and Restorative Service Providers"

## 2022-04-22 ENCOUNTER — Ambulatory Visit: Payer: 59 | Admitting: Rehabilitative and Restorative Service Providers"

## 2022-04-22 DIAGNOSIS — M25532 Pain in left wrist: Secondary | ICD-10-CM

## 2022-04-22 DIAGNOSIS — R278 Other lack of coordination: Secondary | ICD-10-CM

## 2022-04-22 DIAGNOSIS — M25632 Stiffness of left wrist, not elsewhere classified: Secondary | ICD-10-CM | POA: Diagnosis not present

## 2022-04-22 DIAGNOSIS — M6281 Muscle weakness (generalized): Secondary | ICD-10-CM

## 2022-04-22 DIAGNOSIS — R600 Localized edema: Secondary | ICD-10-CM

## 2022-04-22 NOTE — Therapy (Signed)
OUTPATIENT OCCUPATIONAL THERAPY TREATMENT NOTE   Patient Name: Bethany Sharp MRN: [id number] DOB:1945/12/06, 76 y.o., female Today's Date: 04/22/2022  PCP: Alma Friendly, NP REFERRING PROVIDER: Dr. Vanetta Mulders   END OF SESSION:   OT End of Session - 04/22/22 1528     Visit Number 2    Number of Visits 10    Date for OT Re-Evaluation 05/24/22    Authorization Type [employer]    Authorization - Number of Visits 35   combined PT/OT   OT Start Time 1530    OT Stop Time 1611    OT Time Calculation (min) 41 min    Activity Tolerance Patient tolerated treatment well;Patient limited by pain;Patient limited by fatigue    Behavior During Therapy [facility] for tasks assessed/performed             Past Medical History:  Diagnosis Date   Allergies    Anal fissure    Anxiety    Arthritis    CAD (coronary artery disease)    Clotting disorder (HCC)    Colon polyps    Diverticulosis    GERD (gastroesophageal reflux disease)    HTN (hypertension)    Hyperlipidemia    Internal hemorrhoids    Sleep apnea    wears c-pap   Sleep apnea with use of continuous positive airway pressure (CPAP)    Thyroid disease    hypothyroidism   Tubular adenoma of colon    Past Surgical History:  Procedure Laterality Date   APPENDECTOMY  1960   CESAREAN SECTION     COLONOSCOPY     tummy tuck     Patient Active Problem List   Diagnosis Date Noted   GERD (gastroesophageal reflux disease) 01/16/2022   OSA (obstructive sleep apnea) 07/23/2021   Hyperlipidemia 03/29/2021   Coronary artery disease of native artery of native heart with stable angina pectoris ([place]) 01/18/2021   SVT (supraventricular tachycardia) ([place]) 01/18/2021   Tobacco abuse 01/18/2021   Lung nodule 12/07/2020   Family history of aortic dissection 12/07/2020   Family history of colon cancer 12/07/2020   Sensation of fullness in both ears 12/07/2020   Environmental and seasonal allergies 12/07/2020   Essential hypertension  05/26/2019   GAD (generalized anxiety disorder) 05/26/2019   Hypothyroidism 05/26/2019   Chronic foot pain 05/26/2019    ONSET DATE: 02/23/22 DOI   REFERRING DIAG: G40.102V (ICD-10-CM) - Other closed intra-articular fracture of distal end of left radius, initial encounter  THERAPY DIAG:  Pain in left wrist  Muscle weakness (generalized)  Localized edema  Stiffness of left wrist, not elsewhere classified  Other lack of coordination  Rationale for Evaluation and Treatment Rehabilitation  PERTINENT HISTORY: Per MD: "Hand therapy for mobility and strengthening following casting from L wrist fx"  She broke (impacted) distal radius and ulna styloid avulsion fx, per ED notes. She also has spinal T12 compression fracture and is in TLSO brace.    PRECAUTIONS: 8 weeks since fall and fx now, A/PROM ok now, hand strength ok now, wait 1-2 weeks for wrist PRE or dynamics, wean orthotic starting at 8 weeks, wait 10-12 weeks for heavy lifting    WEIGHT BEARING RESTRICTIONS Yes NWB in left hand now   SUBJECTIVE:  She arrives with interpreter, she states not wearing pre-fab brace in day due to hurting her thumb at times. States doing some HEP and getting very sore from it (was not doing correctly after investigation).   PAIN:  Are you having pain? No Rating: 0/10  at rest now in left wrist but some pain (4/10) when moving    OBJECTIVE: (All objective assessments below are from initial evaluation on: 04/11/22 unless otherwise specified.)   HAND DOMINANCE: Right    ADLs: Overall ADLs: States decreased ability to grab, hold household objects, pain and inability to open containers, perform all FMS tasks, etc.      FUNCTIONAL OUTCOME MEASURES: Eval: Patient Specific Functional Scale: TBD (TBD, TBD, TBD) (no time in this visit due to language barriers, etc.)    UPPER EXTREMITY ROM    Eval: makes full but tight fist, can oppose but tight/painful to base of SF. Limited palmar thumb abd compared  to right hand. Overt collapse deformity beginning in left thumb from apparent OA and adduction.    Active ROM Right eval Left eval TBD  Wrist flexion 73 19   Wrist extension 68 30   Wrist ulnar deviation       Wrist radial deviation       Wrist pronation 80 31   Wrist supination 65 65   (Blank rows = not tested)     UPPER EXTREMITY MMT:    TBD when appropriate  MMT Left TBD  Shoulder flexion    Shoulder abduction    Shoulder adduction    Shoulder extension    Shoulder internal rotation    Shoulder external rotation    Middle trapezius    Lower trapezius    Elbow flexion    Elbow extension    Wrist flexion    Wrist extension    Wrist ulnar deviation    Wrist radial deviation    Wrist pronation    Wrist supination    (Blank rows = not tested)   HAND FUNCTION: Eval: Grip strength Right: TBD lbs, Left: TBD lbs    COORDINATION: Eval: Coordination seems decreased due to fingers and wrist being tight, loss of motion and pain. Details TBD.  Box and Blocks Test: TBD Blocks today (TBD is WFL)   SENSATION: Eval:  Light touch intact today, no c/o numbness    EDEMA:             Eval:  Mildly swollen in left hand and wrist today   COGNITION: Overall cognitive status: WFL for evaluation today    OBSERVATIONS:             Eval: apparent left thumb OA and beginning collapse deformity, some redness and swelling about wrist crease     TODAY'S TREATMENT:  04/22/22: OT reviewed HEP as she reported it hurting her and it turns out she was not doing correctly.  OT has her perform with him and she does well (as listed below).  OT also adds light hand strength, IF abd strength, thumb opp strength as below for "stable-c" program and thumb OA management. She tolerates well.   Exercises - Standing Shoulder Flexion Full Range  - 4-5 x daily - 1-2 sets - 10-15 reps - Palm Up / Palm Down  - 3-4 x daily - 1-2 sets - 10-15 reps - Bend and Pull Back Wrist SLOWLY  - 3-4 x daily - 1-2 sets -  10-15 reps - "Windshield Wipers"   - 3-4 x daily - 1-2 sets - 10-15 reps - Tendon Glides  - 3-4 x daily - 3-5 reps - 2-3 seconds hold - Stretch thumb downward   - 2-3 x daily - 3-5 reps - 15 sec hold - Stretch Thumb into "C-Shape" (don't use other thumb)  -  2-3 x daily - 3-5 reps - 15 sec hold - Towel Roll Grip with Forearm in Neutral  - 2-3 x daily - 3-5 reps - 10 sec hold - Resisted Finger Abduction - Index and Middle  - 2-3 x daily - 5-10 reps - 2-3 sec hold - C-Strength (try using rubber band)   - 2-3 x daily - 5-10 reps - 2-3 sec hold    PATIENT EDUCATION: Education details: See tx section above for details  Person educated: Patient Education method: Verbal Instruction, Teach back, Handouts  Education comprehension: States and demonstrates understanding, Additional Education required      HOME EXERCISE PROGRAM: Access Code: [email or web address] URL: [email or web address] Prepared by: Benito Mccreedy   GOALS: Goals reviewed with patient? Yes     SHORT TERM GOALS: (STG required if POC>30 days)   Pt will obtain protective, custom orthotic. Target date: 05/03/22 Goal status: Initial   2.  Pt will demo/state understanding of initial HEP to improve pain levels and prerequisite motion. Target date: 04/26/22 Goal status: 04/22/22- improving      LONG TERM GOALS:   Pt will improve functional ability by decreased impairment per PSFS assessment from TBD to TBD or better, for better quality of life. Target date: 05/24/22 Goal status: INITIAL (will be updated when possible)    2.  Pt will improve grip strength in left hand to at least 40# for functional use at home and in IADLs. Target date: 05/24/22 Goal status: INITIAL   3.  Pt will improve A/ROM in left wrist flex and ext from 19*/30* respectively to at least  50* in both planes, to have functional motion for tasks like reach and grasp.  Target date: 05/24/22 Goal status: INITIAL   4.  Pt will improve strength in left wrist  and arm to at least 4+/5 MMT to have increased functional ability to carry out selfcare and higher-level homecare tasks with no difficulty. Target date: 05/24/22 Goal status: INITIAL   5. Pt will state pain in left arthritic thumb 2/10 or less with resistive functional activities.  Target date: 05/24/22 Goal status: INITIAL       ASSESSMENT:   CLINICAL IMPRESSION: 04/22/22: She was doing HEP too vigorously and still not compliant with wrist immobilizer as asked only 8 weeks post non-op wrist fx.  Most pain c/o is with OA thumb, through. She is tolerating her exercises well in clinic.  May need to move to 2x week if she is not understanding HEP /doing well     PLAN: OT FREQUENCY: 2x/week (though she is motivated / requesting 1/week for now)    OT DURATION: 6 weeks   PLANNED INTERVENTIONS: self care/ADL training, therapeutic exercise, therapeutic activity, manual therapy, passive range of motion, splinting, ultrasound, fluidotherapy, compression bandaging, moist heat, cryotherapy, contrast bath, patient/family education, and coping strategies training   RECOMMENDED OTHER SERVICES: She should have an interpreter with her    CONSULTED AND AGREED WITH PLAN OF CARE: Patient and family member/caregiver   PLAN FOR NEXT SESSION:  Consider making custom wrist immobilization orthotic due to pre-fab causing pain/problems (could make thumb spica if thumb very painful, but likely not necessary). She is due to start weaning from braces during the day for light ADLS now, though, so this may not be needed. (Goal to not need brace after 10 weeks).   Review HEP for broken left wrist and left thumb CMC J OA management, add light/ progressive wrist stretches in flex and ext (also forearm stretches as tolerated)  if tolerated in the next 1-2 weeks/visits and hand and wrist strength 1-2 weeks (visits) after that (as tolerated).    Benito Mccreedy, OTR/L, CHT 04/22/2022, 5:32 PM

## 2022-04-25 ENCOUNTER — Telehealth: Payer: Self-pay | Admitting: Primary Care

## 2022-04-25 NOTE — Telephone Encounter (Signed)
Received refill request for pantoprazole, however in the last phone note I see that she was going to try lansoprazole? Now pantoprazole is back on her list from "historical provider".

## 2022-04-26 ENCOUNTER — Telehealth: Payer: Self-pay | Admitting: Internal Medicine

## 2022-04-26 NOTE — Telephone Encounter (Signed)
Inbound patients son stating she is in need of a refill for Protonix. Please advise.

## 2022-04-26 NOTE — Telephone Encounter (Signed)
Dr Vena Rua last office note indicates that patient is to be on lansoprazole as pantoprazole was ineffective. I have left a voicemail for patient indicating this and advising that we can refill lansoprazole but she shouldn't be on pantoprazole any longer.

## 2022-04-26 NOTE — Telephone Encounter (Signed)
I have spoken to patient's son, Mr.Petit who states that patient did not start the lansoprazole because it was too expensive ($200 cost per month). It appears she has previously tried omeprazole and pantoprazole as well without adequate symptom control. Dexilant (as previously rx'ed also not covered by insurance). Due to this, patient would just like to back on pantoprazole. Dr Hilarie Fredrickson, is it okay to send in pantoprazole again or do I need to continue trying for other PPI's due to previous inadequate pantoprazole response?

## 2022-04-29 ENCOUNTER — Ambulatory Visit: Payer: 59 | Admitting: Rehabilitative and Restorative Service Providers"

## 2022-04-29 ENCOUNTER — Encounter: Payer: Self-pay | Admitting: Rehabilitative and Restorative Service Providers"

## 2022-04-29 DIAGNOSIS — R278 Other lack of coordination: Secondary | ICD-10-CM

## 2022-04-29 DIAGNOSIS — M25532 Pain in left wrist: Secondary | ICD-10-CM | POA: Diagnosis not present

## 2022-04-29 DIAGNOSIS — R600 Localized edema: Secondary | ICD-10-CM

## 2022-04-29 DIAGNOSIS — M6281 Muscle weakness (generalized): Secondary | ICD-10-CM | POA: Diagnosis not present

## 2022-04-29 DIAGNOSIS — M25632 Stiffness of left wrist, not elsewhere classified: Secondary | ICD-10-CM | POA: Diagnosis not present

## 2022-04-29 MED ORDER — PANTOPRAZOLE SODIUM 40 MG PO TBEC: 40.0000 mg | DELAYED_RELEASE_TABLET | Freq: Two times a day (BID) | ORAL | 3 refills | Status: DC

## 2022-04-29 NOTE — Addendum Note (Signed)
Addended by: Dorisann Frames L on: 04/29/2022 10:28 AM   Modules accepted: Orders

## 2022-04-29 NOTE — Therapy (Addendum)
OUTPATIENT OCCUPATIONAL THERAPY TREATMENT & DISCHARGE NOTE   Patient Name: Bethany Sharp MRN: [id number] DOB:1946/08/19, 76 y.o., female Today's Date: 04/29/2022  PCP: Alma Friendly, NP REFERRING PROVIDER: Dr. Vanetta Mulders   END OF SESSION:   OT End of Session - 04/29/22 1517     Visit Number 3    Number of Visits 10    Date for OT Re-Evaluation 05/24/22    Authorization Type [employer]et[employer]    Authorization - Number of Visits 35   combined PT/OT   OT Start Time 1520    OT Stop Time 1615    OT Time Calculation (min) 55 min    Activity Tolerance Patient tolerated treatment well;Patient limited by pain;Patient limited by fatigue    Behavior During Therapy [facility] for tasks assessed/performed              Past Medical History:  Diagnosis Date   Allergies    Anal fissure    Anxiety    Arthritis    CAD (coronary artery disease)    Clotting disorder (HCC)    Colon polyps    Diverticulosis    GERD (gastroesophageal reflux disease)    HTN (hypertension)    Hyperlipidemia    Internal hemorrhoids    Sleep apnea    wears c-pap   Sleep apnea with use of continuous positive airway pressure (CPAP)    Thyroid disease    hypothyroidism   Tubular adenoma of colon    Past Surgical History:  Procedure Laterality Date   APPENDECTOMY  1960   CESAREAN SECTION     COLONOSCOPY     tummy tuck     Patient Active Problem List   Diagnosis Date Noted   GERD (gastroesophageal reflux disease) 01/16/2022   OSA (obstructive sleep apnea) 07/23/2021   Hyperlipidemia 03/29/2021   Coronary artery disease of native artery of native heart with stable angina pectoris ([place]) 01/18/2021   SVT (supraventricular tachycardia) ([place]) 01/18/2021   Tobacco abuse 01/18/2021   Lung nodule 12/07/2020   Family history of aortic dissection 12/07/2020   Family history of colon cancer 12/07/2020   Sensation of fullness in both ears 12/07/2020   Environmental and seasonal allergies 12/07/2020   Essential  hypertension 05/26/2019   GAD (generalized anxiety disorder) 05/26/2019   Hypothyroidism 05/26/2019   Chronic foot pain 05/26/2019    ONSET DATE: 02/23/22 DOI   REFERRING DIAG: Q75.916B (ICD-10-CM) - Other closed intra-articular fracture of distal end of left radius, initial encounter  THERAPY DIAG:  Muscle weakness (generalized)  Localized edema  Other lack of coordination  Pain in left wrist  Stiffness of left wrist, not elsewhere classified  Rationale for Evaluation and Treatment Rehabilitation  PERTINENT HISTORY: Per MD: "Hand therapy for mobility and strengthening following casting from L wrist fx"  She broke (impacted) distal radius and ulna styloid avulsion fx, per ED notes. She also has spinal T12 compression fracture and is in TLSO brace.    PRECAUTIONS: 9+ weeks since fall and fx now, A/PROM ok now, hand strength ok now, wait 1 week for wrist PRE or dynamics, start weaning orthotic, wait 10-12 weeks for heavy lifting    WEIGHT BEARING RESTRICTIONS Yes WBAT in left hand now   SUBJECTIVE:  She arrives but no interpreter comes, so OT uses translation software during session today. She states ulnar side of wrist still painful at times, also OA thumb CMC J. She states weaning her wrist brace at home and getting in a lot of natural daily motion.  PAIN:  Are you having pain? Yes Rating: 1-2/10 very mild at ulnar side of wrist and sore/OA CMC J    OBJECTIVE: (All objective assessments below are from initial evaluation on: 04/11/22 unless otherwise specified.)   HAND DOMINANCE: Right    ADLs: Overall ADLs: States decreased ability to grab, hold household objects, pain and inability to open containers, perform all FMS tasks, etc.      FUNCTIONAL OUTCOME MEASURES: Eval: Patient Specific Functional Scale: TBD (TBD, TBD, TBD) (no time in this visit due to language barriers, etc.)    UPPER EXTREMITY ROM    Eval: makes full but tight fist, can oppose but tight/painful to  base of SF. Limited palmar thumb abd compared to right hand. Overt collapse deformity beginning in left thumb from apparent OA and adduction.    Active ROM Right eval Left eval LEFT TBD  Wrist flexion 73 19   Wrist extension 68 30   Wrist ulnar deviation       Wrist radial deviation       Wrist pronation 80 31   Wrist supination 65 65   (Blank rows = not tested)     UPPER EXTREMITY MMT:    TBD when appropriate  MMT Left TBD  Shoulder flexion    Shoulder abduction    Shoulder adduction    Shoulder extension    Shoulder internal rotation    Shoulder external rotation    Middle trapezius    Lower trapezius    Elbow flexion    Elbow extension    Wrist flexion    Wrist extension    Wrist ulnar deviation    Wrist radial deviation    Wrist pronation    Wrist supination    (Blank rows = not tested)   HAND FUNCTION: 04/29/22: Grip strength Right: TBD lbs, Left: TBD lbs    COORDINATION: Eval: Coordination seems decreased due to fingers and wrist being tight, loss of motion and pain. Details TBD.  Box and Blocks Test: TBD Blocks today (TBD is WFL)   SENSATION: Eval:  Light touch intact today, no c/o numbness    EDEMA:             Eval:  Mildly swollen in left hand and wrist today   COGNITION: Overall cognitive status: WFL for evaluation today    OBSERVATIONS:             Eval: apparent left thumb OA and beginning collapse deformity, some redness and swelling about wrist crease     TODAY'S TREATMENT:  04/29/22: OT reviews HEP and upgrades to new wrist PROM/stretches and hand strengthening with therapy putty now. She is edu as below, states understanding through translation apps on phone. She does continue to have thumb CMC J pain complaints and "clinking" in thumb, and is encouraged to not do anything painful or repetitively painful. She is also provided with [facility] Comfy Cool pre-fab brace to support painful thumb and also it supports wrist somewhat as well. She states  understanding the therapy HEP and POC.   Exercises - Standing Shoulder Flexion Full Range  - 4-5 x daily - 1-2 sets - 10-15 reps - Palm Up / Palm Down  - 3-4 x daily - 1-2 sets - 10-15 reps - Wrist Extension Stretch Pronated  - 3-4 x daily - 3-5 reps - 15 hold - Seated Wrist Flexion with Overpressure  - 3-4 x daily - 3-5 reps - 15 sec hold - Seated Wrist Ulnar Deviation Stretch  -  3-4 x daily - 3-5 reps - 15 sec hold - Seated Wrist Radial Deviation Stretch  - 3-4 x daily - 3-5 reps - 15 hold - Bend and Pull Back Wrist SLOWLY  - 3-4 x daily - 1-2 sets - 10-15 reps - "Windshield Wipers"   - 3-4 x daily - 1-2 sets - 10-15 reps - Stretch thumb downward   - 2-3 x daily - 3-5 reps - 15 sec hold - Stretch Thumb into "C-Shape" (don't use other thumb)  - 2-3 x daily - 3-5 reps - 15 sec hold - Resisted Finger Abduction - Index and Middle  - 2-3 x daily - 5-10 reps - 2-3 sec hold - Full Fist  - 2-3 x daily - 3 reps - Seated Claw Fist with Putty  - 2-3 x daily - 3 reps - "Duck Mouth" Strength  - 2-3 x daily - 3 reps - Finger Extension "Pizza!"   - 2-3 x daily - 3 reps - Thumb Press  - 2-3 x daily - 3 reps - Thumb Opposition with Putty  - 2-3 x daily - 3 reps  PATIENT EDUCATION: Education details: See tx section above for details  Person educated: Patient Education method: Verbal Instruction, Teach back, Handouts  Education comprehension: States and demonstrates understanding, Additional Education required      HOME EXERCISE PROGRAM: Access Code: [email or web address] URL: [email or web address] Prepared by: Benito Mccreedy   GOALS: Goals reviewed with patient? Yes     SHORT TERM GOALS: (STG required if POC>30 days)   Pt will obtain protective, custom orthotic. Target date: D/C Goal status: N/A, DC   2.  Pt will demo/state understanding of initial HEP to improve pain levels and prerequisite motion. Target date: 04/26/22 Goal status: 04/29/22- MET      LONG TERM GOALS:   Pt will  improve functional ability by decreased impairment per PSFS assessment from TBD to TBD or better, for better quality of life. Target date: 05/24/22 Goal status: INITIAL (will be updated when possible)    2.  Pt will improve grip strength in left hand to at least 40# for functional use at home and in IADLs. Target date: 05/24/22 Goal status: INITIAL   3.  Pt will improve A/ROM in left wrist flex and ext from 19*/30* respectively to at least  50* in both planes, to have functional motion for tasks like reach and grasp.  Target date: 05/24/22 Goal status: INITIAL   4.  Pt will improve strength in left wrist and arm to at least 4+/5 MMT to have increased functional ability to carry out selfcare and higher-level homecare tasks with no difficulty. Target date: 05/24/22 Goal status: INITIAL   5. Pt will state pain in left arthritic thumb 2/10 or less with resistive functional activities.  Target date: 05/24/22 Goal status: INITIAL       ASSESSMENT:   CLINICAL IMPRESSION: 04/29/22: She has full HEP to progress slowly, as tolerated with stretches at wrist, thumb, and strength in hand. She will work on this for 3 weeks independently (she was told to come in sooner if any problems), and follow up in early Aug with OT. She also has MD f/u next week.   04/22/22: She was doing HEP too vigorously and still not compliant with wrist immobilizer as asked only 8 weeks post non-op wrist fx.  Most pain c/o is with OA thumb, through. She is tolerating her exercises well in clinic.  May need to move to 2x week if she is  not understanding HEP /doing well     PLAN: OT FREQUENCY: 2x/week (though she is motivated / requesting 1/week for now)    OT DURATION: 6 weeks   PLANNED INTERVENTIONS: self care/ADL training, therapeutic exercise, therapeutic activity, manual therapy, passive range of motion, splinting, ultrasound, fluidotherapy, compression bandaging, moist heat, cryotherapy, contrast bath, patient/family  education, and coping strategies training   RECOMMENDED OTHER SERVICES: She should have an interpreter with her    CONSULTED AND AGREED WITH PLAN OF CARE: Patient and family member/caregiver   PLAN FOR NEXT SESSION:  Reassess status, motion, strength, add wrist strength as needed.   Benito Mccreedy, OTR/L, CHT 04/29/2022, 4:24 PM    OCCUPATIONAL THERAPY DISCHARGE SUMMARY  Visits from Start of Care: 3  She was doing well and was to self-manage with HEP for 3 weeks and return to therapy. She did not return, and it's been months, so OT will d/c POC at this point.  Goals could not be addressed, please see notes for details.   Benito Mccreedy, OTR/L, CHT 08/14/22

## 2022-04-29 NOTE — Telephone Encounter (Signed)
Informed patient's son that Dr. Hilarie Fredrickson states its fine for his mother to resume pantoprazole 40 mg twice daily. A new prescription has been sent to her pharmacy. Patients son verbalized understanding.

## 2022-04-29 NOTE — Telephone Encounter (Signed)
Can resume pantoprazole, part of the issue I think before was adherence JMP

## 2022-05-06 ENCOUNTER — Ambulatory Visit (HOSPITAL_BASED_OUTPATIENT_CLINIC_OR_DEPARTMENT_OTHER): Payer: 59 | Admitting: Orthopaedic Surgery

## 2022-05-07 ENCOUNTER — Encounter: Payer: 59 | Admitting: Occupational Therapy

## 2022-05-08 ENCOUNTER — Ambulatory Visit (INDEPENDENT_AMBULATORY_CARE_PROVIDER_SITE_OTHER): Payer: 59 | Admitting: Orthopaedic Surgery

## 2022-05-08 ENCOUNTER — Ambulatory Visit (INDEPENDENT_AMBULATORY_CARE_PROVIDER_SITE_OTHER): Payer: 59

## 2022-05-08 DIAGNOSIS — M25532 Pain in left wrist: Secondary | ICD-10-CM

## 2022-05-08 DIAGNOSIS — S52572A Other intraarticular fracture of lower end of left radius, initial encounter for closed fracture: Secondary | ICD-10-CM

## 2022-05-08 DIAGNOSIS — S52352A Displaced comminuted fracture of shaft of radius, left arm, initial encounter for closed fracture: Secondary | ICD-10-CM | POA: Diagnosis not present

## 2022-05-08 NOTE — Progress Notes (Signed)
Chief Complaint: Left wrist pain     History of Present Illness:   05/08/2022: Presents today for follow-up of her left wrist.  She is also complaining of left thumb base pain which has been sore for quite some time but has been worsened after he has been on the cast.  Overall she is working with physical therapy and her range of motion is improving significantly.  Here today for further assessment.  Ailed Defibaugh is a 76 y.o. female right-hand-dominant presents today for follow-up of her left wrist fracture.She is here today for splint removal and replacement into a cast.  She states that overall the left wrist is feeling much better.   Surgical History:   None  PMH/PSH/Family History/Social History/Meds/Allergies:    Past Medical History:  Diagnosis Date  . Allergies   . Anal fissure   . Anxiety   . Arthritis   . CAD (coronary artery disease)   . Clotting disorder ([place])   . Colon polyps   . Diverticulosis   . GERD (gastroesophageal reflux disease)   . HTN (hypertension)   . Hyperlipidemia   . Internal hemorrhoids   . Sleep apnea    wears c-pap  . Sleep apnea with use of continuous positive airway pressure (CPAP)   . Thyroid disease    hypothyroidism  . Tubular adenoma of colon    Past Surgical History:  Procedure Laterality Date  . APPENDECTOMY  1960  . CESAREAN SECTION    . COLONOSCOPY    . tummy tuck     Social History   Socioeconomic History  . Marital status: Divorced    Spouse name: Not on file  . Number of children: 1  . Years of education: Not on file  . Highest education level: Not on file  Occupational History  . Not on file  Tobacco Use  . Smoking status: Every Day    Packs/day: 0.25    Years: 57.00    Total pack years: 14.25    Types: Cigarettes    Start date: 7  . Smokeless tobacco: Never  . Tobacco comments:    smokes about 4 cigs a day  Vaping Use  . Vaping Use: Never used  Substance and Sexual  Activity  . Alcohol use: Yes    Comment: wine 2 glasses per day  . Drug use: Never  . Sexual activity: Not on file  Other Topics Concern  . Not on file  Social History Narrative  . Not on file   Social Determinants of Health   Financial Resource Strain: Not on file  Food Insecurity: Not on file  Transportation Needs: Not on file  Physical Activity: Not on file  Stress: Not on file  Social Connections: Not on file   Family History  Problem Relation Age of Onset  . Coronary artery disease Mother   . Colon cancer Sister   . Esophageal cancer Neg Hx   . Rectal cancer Neg Hx   . Stomach cancer Neg Hx   . Pancreatic cancer Neg Hx    No Known Allergies Current Outpatient Medications  Medication Sig Dispense Refill  . ASPIRIN LOW DOSE 81 MG tablet TAKE 1 TABLET (81 MG TOTAL) BY MOUTH DAILY. SWALLOW WHOLE. 90 tablet 3  . atorvastatin (LIPITOR) 20 MG tablet Take 1 tablet (20  mg total) by mouth daily. 90 tablet 3  . Carbinoxamine Maleate 4 MG TABS Take 1 tablet (4 mg total) by mouth 2 (two) times daily. For allergies. 180 tablet 0  . fluticasone (FLONASE) 50 MCG/ACT nasal spray Place 1 spray into both nostrils 2 (two) times daily. 16 g 5  . irbesartan (AVAPRO) 75 MG tablet Take 1 tablet (75 mg total) by mouth daily. For blood pressure. 90 tablet 3  . isosorbide mononitrate (IMDUR) 30 MG 24 hr tablet Take 0.5 tablets (15 mg total) by mouth daily. 45 tablet 3  . levothyroxine (SYNTHROID) 50 MCG tablet Take 1 tablet by mouth every morning on an empty stomach with water only.  No food or other medications for 30 minutes. 90 tablet 3  . methocarbamol (ROBAXIN) 500 MG tablet Take 1 tablet (500 mg total) by mouth 2 (two) times daily. 20 tablet 0  . pantoprazole (PROTONIX) 40 MG tablet Take 1 tablet (40 mg total) by mouth 2 (two) times daily before a meal. 60 tablet 3  . sertraline (ZOLOFT) 50 MG tablet Take 1 tablet (50 mg total) by mouth daily. For anxiety. 90 tablet 3   No current  facility-administered medications for this visit.   No results found.  Review of Systems:   A ROS was performed including pertinent positives and negatives as documented in the HPI.  Physical Exam :   Constitutional: NAD and appears stated age Neurological: Alert and oriented Psych: Appropriate affect and cooperative There were no vitals taken for this visit.   Comprehensive Musculoskeletal Exam:     She is able to fire anterior interosseous as well as posterior interosseous nerves.  Sensation intact in all divisions of the left hand.  Fingers warm and well-perfused.  Left wrist dorsiflexion is to 20 degrees as well as plantarflexion.  There is pain at the [facility] base with a positive grind test  Imaging:   Xray (3 views left wrist): There is a intra-articular distal radius fracture as well as ulnar styloid fracture.  There is increased callus formation consistent with ongoing healing. There is significant CMC arthritis involving the left hand   I personally reviewed and interpreted the radiographs.   Assessment:   76 y.o. female with left distal radius fracture after a fall on her deck.  Overall this continues to improve and she is making significant improvements with physical therapy.  At this time I would like to perform a left CMC injection as well as she does have some arthritis that I believe was flared up by cast placement.  We will plan to proceed with this and I will see her back in 2 months for reassessment  Plan :    -Left thumb CMC injection performed today after verbal consent obtained    Procedure Note  Patient: Bethany Sharp             Date of Birth: 05/29/1946           MRN: [id number]             Visit Date: 05/08/2022  Procedures: Visit Diagnoses:  1. Other closed intra-articular fracture of distal end of left radius, initial encounter     Hand/UE Inj: L thumb CMC for osteoarthritis on 05/08/2022 12:05 PM         I personally saw and evaluated the  patient, and participated in the management and treatment plan.  Vanetta Mulders, MD Attending Physician, Orthopedic Surgery  This document was dictated using Dragon voice recognition software.  A reasonable attempt at proof reading has been made to minimize errors.

## 2022-05-09 ENCOUNTER — Other Ambulatory Visit: Payer: Self-pay | Admitting: Internal Medicine

## 2022-05-14 ENCOUNTER — Encounter: Payer: 59 | Admitting: Occupational Therapy

## 2022-05-21 DIAGNOSIS — G4733 Obstructive sleep apnea (adult) (pediatric): Secondary | ICD-10-CM | POA: Diagnosis not present

## 2022-05-28 DIAGNOSIS — S22080A Wedge compression fracture of T11-T12 vertebra, initial encounter for closed fracture: Secondary | ICD-10-CM | POA: Diagnosis not present

## 2022-05-28 DIAGNOSIS — Z6825 Body mass index (BMI) 25.0-25.9, adult: Secondary | ICD-10-CM | POA: Diagnosis not present

## 2022-05-30 ENCOUNTER — Other Ambulatory Visit: Payer: Self-pay | Admitting: Primary Care

## 2022-05-30 DIAGNOSIS — F411 Generalized anxiety disorder: Secondary | ICD-10-CM

## 2022-06-21 DIAGNOSIS — G4733 Obstructive sleep apnea (adult) (pediatric): Secondary | ICD-10-CM | POA: Diagnosis not present

## 2022-06-29 ENCOUNTER — Other Ambulatory Visit: Payer: Self-pay | Admitting: Primary Care

## 2022-06-29 DIAGNOSIS — F411 Generalized anxiety disorder: Secondary | ICD-10-CM

## 2022-07-02 ENCOUNTER — Encounter: Payer: Self-pay | Admitting: Internal Medicine

## 2022-07-02 ENCOUNTER — Ambulatory Visit: Payer: 59 | Admitting: Internal Medicine

## 2022-07-02 VITALS — BP 120/70 | HR 72 | Ht 64.0 in | Wt 157.4 lb

## 2022-07-02 DIAGNOSIS — K2100 Gastro-esophageal reflux disease with esophagitis, without bleeding: Secondary | ICD-10-CM

## 2022-07-02 DIAGNOSIS — K219 Gastro-esophageal reflux disease without esophagitis: Secondary | ICD-10-CM

## 2022-07-02 MED ORDER — OMEPRAZOLE 40 MG PO CPDR: 40.0000 mg | DELAYED_RELEASE_CAPSULE | Freq: Two times a day (BID) | ORAL | 3 refills | Status: DC

## 2022-07-02 NOTE — Progress Notes (Signed)
   Subjective:    Patient ID: Larenda Reedy, female    DOB: 1946-01-27, 76 y.o.   MRN: [id number]  HPI Jaxsyn Catalfamo is a 76 year old female with a history of GERD with esophagitis, nonobstructing Schatzki's ring, erosive gastritis without H. pylori, history of colon polyps and family history of colon cancer who is here for follow-up.  She is here today with her friend who helps with [place] translation.  Other medical history includes CAD, sleep apnea, hypertension and hyperlipidemia.  Despite pantoprazole 40 mg twice daily she still having some regurgitation, oral and tongue burning sensation.  At times she also feels fullness in her bilateral ears which she relates to reflux.  She saw ENT and had a negative evaluation.  Reflux symptoms are definitively better with pantoprazole than without but not completely resolved.  Regular bowel movements.   Review of Systems As per HPI, otherwise negative  Current Medications, Allergies, Past Medical History, Past Surgical History, Family History and Social History were reviewed in [employer] record.      Objective:   Physical Exam BP 120/70 (BP Location: Left Arm, Patient Position: Sitting, Cuff Size: Normal)   Pulse 72 Comment: irregular  Ht '5\' 4"'$  (1.626 m)   Wt 157 lb 6 oz (71.4 kg)   BMI 27.01 kg/m  Gen: awake, alert, NAD HEENT: anicteric Neuro: nonfocal      Assessment & Plan:  76 year old female with a history of GERD with esophagitis, nonobstructing Schatzki's ring, erosive gastritis without H. pylori, history of colon polyps and family history of colon cancer who is here for follow-up  GERD with esophagitis/LPR --symptoms persistent despite pantoprazole 40 mg twice daily.  Dexilant was cost prohibitive.  We discussed the diagnosis.  We discussed lifestyle and diet modifications.  I recommended she elevate the head of her bed 30 degrees.  I also going to try another PPI and if ineffective we should repeat upper  endoscopy.  Her son gave her the Betaine which is designed to increase pepsin and acid production.  I explained to her why this is not recommended for her reflux. -- Discontinue pantoprazole -- Try omeprazole 40 mg twice daily  -- GERD diet and lifestyle modifications -- I recommended she try to lose 10 to 15 pounds to help reflux. -- If symptoms not improved after 1 month and repeat EGD, she was asked to notify me via MyChart message or phone call  2.  History of colon cancer and personal history of colonic polyps --surveillance colonoscopy January 2026  30 minutes total spent today including patient facing time, coordination of care, reviewing medical history/procedures/pertinent radiology studies, and documentation of the encounter.

## 2022-07-02 NOTE — Patient Instructions (Signed)
_______________________________________________________  If you are age 77 or older, your body mass index should be between 23-30. Your Body mass index is 27.01 kg/m. If this is out of the aforementioned range listed, please consider follow up with your Primary Care Provider.  If you are age 64 or younger, your body mass index should be between 19-25. Your Body mass index is 27.01 kg/m. If this is out of the aformentioned range listed, please consider follow up with your Primary Care Provider.   ________________________________________________________  The [facility] providers would like to encourage you to use [facility] to communicate with providers for non-urgent requests or questions.  Due to long hold times on the telephone, sending your provider a message by [facility] may be a faster and more efficient way to get a response.  Please allow 48 business hours for a response.  Please remember that this is for non-urgent requests.  _______________________________________________________  We have sent the following medications to your pharmacy for you to pick up at your convenience:  Omeprazole  Send Dr. Hilarie Fredrickson a message in a month to let him know if you have improved

## 2022-07-03 ENCOUNTER — Ambulatory Visit (INDEPENDENT_AMBULATORY_CARE_PROVIDER_SITE_OTHER): Payer: 59

## 2022-07-03 ENCOUNTER — Ambulatory Visit (INDEPENDENT_AMBULATORY_CARE_PROVIDER_SITE_OTHER): Payer: 59 | Admitting: Orthopaedic Surgery

## 2022-07-03 DIAGNOSIS — S52572A Other intraarticular fracture of lower end of left radius, initial encounter for closed fracture: Secondary | ICD-10-CM | POA: Diagnosis not present

## 2022-07-03 DIAGNOSIS — M545 Low back pain, unspecified: Secondary | ICD-10-CM | POA: Diagnosis not present

## 2022-07-03 DIAGNOSIS — G8929 Other chronic pain: Secondary | ICD-10-CM

## 2022-07-03 DIAGNOSIS — S52502A Unspecified fracture of the lower end of left radius, initial encounter for closed fracture: Secondary | ICD-10-CM | POA: Diagnosis not present

## 2022-07-03 NOTE — Progress Notes (Signed)
Chief Complaint: Left wrist pain     History of Present Illness:   07/03/2022: Follow-up of her left wrist.  Overall she is still having some ulnar-sided pain.  She is here today for further assessment.  The pain is worse with any type of pro supination pain.  Denies any pain over the distal radius.  Bethany Sharp is a 76 y.o. female right-hand-dominant presents today for follow-up of her left wrist fracture.She is here today for splint removal and replacement into a cast.  She states that overall the left wrist is feeling much better.   Surgical History:   None  PMH/PSH/Family History/Social History/Meds/Allergies:    Past Medical History:  Diagnosis Date   Allergies    Anal fissure    Anxiety    Arthritis    CAD (coronary artery disease)    Clotting disorder ([facility])    Colon polyps    Diverticulosis    GERD (gastroesophageal reflux disease)    HTN (hypertension)    Hyperlipidemia    Internal hemorrhoids    Sleep apnea    wears c-pap   Sleep apnea with use of continuous positive airway pressure (CPAP)    Thyroid disease    hypothyroidism   Tubular adenoma of colon    Past Surgical History:  Procedure Laterality Date   APPENDECTOMY  1960   CESAREAN SECTION     COLONOSCOPY     tummy tuck     Social History   Socioeconomic History   Marital status: Divorced    Spouse name: Not on file   Number of children: 1   Years of education: Not on file   Highest education level: Not on file  Occupational History   Not on file  Tobacco Use   Smoking status: Every Day    Packs/day: 0.25    Years: 57.00    Total pack years: 14.25    Types: Cigarettes    Start date: 1962   Smokeless tobacco: Never   Tobacco comments:    smokes about 4 cigs a day  Vaping Use   Vaping Use: Never used  Substance and Sexual Activity   Alcohol use: Yes    Comment: wine 2 glasses per day   Drug use: Never   Sexual activity: Not on file  Other Topics  Concern   Not on file  Social History Narrative   Not on file   Social Determinants of Health   Financial Resource Strain: Not on file  Food Insecurity: Not on file  Transportation Needs: Not on file  Physical Activity: Not on file  Stress: Not on file  Social Connections: Not on file   Family History  Problem Relation Age of Onset   Coronary artery disease Mother    Colon cancer Sister    Esophageal cancer Neg Hx    Rectal cancer Neg Hx    Stomach cancer Neg Hx    Pancreatic cancer Neg Hx    No Known Allergies Current Outpatient Medications  Medication Sig Dispense Refill   ASPIRIN LOW DOSE 81 MG tablet TAKE 1 TABLET (81 MG TOTAL) BY MOUTH DAILY. SWALLOW WHOLE. 90 tablet 3   atorvastatin (LIPITOR) 20 MG tablet TAKE 1 TABLET BY MOUTH EVERY DAY 30 tablet 8   Carbinoxamine Maleate 4 MG TABS Take 1 tablet (4  mg total) by mouth 2 (two) times daily. For allergies. 180 tablet 0   fluticasone (FLONASE) 50 MCG/ACT nasal spray Place 1 spray into both nostrils 2 (two) times daily. 16 g 5   irbesartan (AVAPRO) 75 MG tablet Take 1 tablet (75 mg total) by mouth daily. For blood pressure. 90 tablet 3   isosorbide mononitrate (IMDUR) 30 MG 24 hr tablet TAKE 1/2 OF A TABLET (15 MG TOTAL) BY MOUTH DAILY 15 tablet 8   levothyroxine (SYNTHROID) 50 MCG tablet Take 1 tablet by mouth every morning on an empty stomach with water only.  No food or other medications for 30 minutes. 90 tablet 3   methocarbamol (ROBAXIN) 500 MG tablet Take 1 tablet (500 mg total) by mouth 2 (two) times daily. 20 tablet 0   omeprazole (PRILOSEC) 40 MG capsule Take 1 capsule (40 mg total) by mouth 2 (two) times daily before a meal. 180 capsule 3   sertraline (ZOLOFT) 50 MG tablet TAKE 1 TABLET (50 MG TOTAL) BY MOUTH DAILY. FOR ANXIETY. 90 tablet 0   No current facility-administered medications for this visit.   No results found.  Review of Systems:   A ROS was performed including pertinent positives and negatives as  documented in the HPI.  Physical Exam :   Constitutional: NAD and appears stated age Neurological: Alert and oriented Psych: Appropriate affect and cooperative There were no vitals taken for this visit.   Comprehensive Musculoskeletal Exam:     She is able to fire anterior interosseous as well as posterior interosseous nerves.  Sensation intact in all divisions of the left hand.  Fingers warm and well-perfused.  Left wrist dorsiflexion is to 20 degrees as well as plantarflexion.  There is pain about the ulnar aspect of the wrist  Imaging:   Xray (3 views left wrist): There is a intra-articular distal radius fracture as well as ulnar styloid fracture.  Her fracture is healed with some dorsal angulation There is significant CMC arthritis involving the left hand.  There is some ulnar styloid abutment on the lunate.   I personally reviewed and interpreted the radiographs.   Assessment:   76 y.o. female with left distal radius fracture after a fall on her deck.  Overall her fracture is well-healed at this time.  I believe she does have some residual symptoms from ulnar abutment and impingement given the fact that there is radial shortening.  I did discuss the possible role for surgery although she is remaining reluctant to any surgery.  To that effect we will plan for a left ulnar ultrasound-guided wrist injection    Plan :    -Left TFCC wrist injection performed today under ultrasound guidance    Procedure Note  Patient: Bethany Sharp             Date of Birth: [birth year]           MRN: [id number]             Visit Date: 07/03/2022  Procedures: Visit Diagnoses:  1. Other closed intra-articular fracture of distal end of left radius, initial encounter   2. Chronic low back pain, unspecified back pain laterality, unspecified whether sciatica present     Small Joint Inj on 07/03/2022 11:43 AM         I personally saw and evaluated the patient, and participated in the  management and treatment plan.  Vanetta Mulders, MD Attending Physician, Orthopedic Surgery  This document was dictated using Dragon voice recognition software.  A reasonable attempt at proof reading has been made to minimize errors.

## 2022-07-04 ENCOUNTER — Other Ambulatory Visit (HOSPITAL_COMMUNITY): Payer: Self-pay

## 2022-07-04 ENCOUNTER — Other Ambulatory Visit: Payer: Self-pay | Admitting: Primary Care

## 2022-07-04 ENCOUNTER — Telehealth: Payer: Self-pay | Admitting: Pharmacy Technician

## 2022-07-04 ENCOUNTER — Other Ambulatory Visit: Payer: Self-pay | Admitting: Internal Medicine

## 2022-07-04 DIAGNOSIS — I10 Essential (primary) hypertension: Secondary | ICD-10-CM

## 2022-07-04 NOTE — Telephone Encounter (Signed)
PA has been submitted and telephone encounter has been created 

## 2022-07-04 NOTE — Telephone Encounter (Signed)
Patient Advocate Encounter  Received notification that prior authorization for OMEPRAZOLE is required.   PA submitted on 9.21.23 Key [id number] Status is pending    Bethany Sharp, CPhT Patient Advocate Phone: [phone]

## 2022-07-08 NOTE — Telephone Encounter (Signed)
Patient Advocate Encounter  Prior Authorization for Omeprazole '40mg'$  capsule has been approved.     Effective: 07-05-2022 to 07-06-2023

## 2022-07-16 ENCOUNTER — Other Ambulatory Visit: Payer: Self-pay | Admitting: Primary Care

## 2022-07-16 DIAGNOSIS — E039 Hypothyroidism, unspecified: Secondary | ICD-10-CM

## 2022-07-19 NOTE — Progress Notes (Unsigned)
07/23/21- 22 yoF Smoker (14 pkyrs, 1-2 cigs/ day) for sleep evaluation courtesy of Alma Friendly, NP with concern of OSA, old CPAP. Medical problem list includes SVT, HTN, CAD, Hypothyroid, Tobacco Use, Lung Nodule, Hyperlipidemia, Anxiety Moved from [place] 1 year ago, speaking mostly [place]. CPAP 76 yrs old, no recent sleep study. Epworth score-4 Body weight today-158 lbs Covid vax-no Flu vax-no Son is here as Optometrist Original sleep study in [place] and using currently on [place] on unknown settings. Sleeps comfortably with it. Originally snored. Never great sleeper. No ENT surgery. Not aware of lung disease. We reviewed alternatives to CPAP but she is used to it and prefers to stick with CPAP.  07/22/22-76yoF Smoker (14 pkyrs, 4 cigs/ day) from [place] 2021/ speaking mostly [place], followed for OSA, complicated by SVT, HTN, CAD, Hypothyroid, Tobacco Use, Lung Nodule, Hyperlipidemia, Anxiety Original sleep study in [place] and was using currently a ResMed machine on unknown settings. HST 10/18/21- AHI 14.6/ hr, desaturation to 85%, body weight 156 lbs  CPAP auto 5-15/ [facility]   replaced 11/06/21 Download compliance Body weight today-158 lbs Covid vax- Flu vax-   ROS-see HPI   + = positive Constitutional:    weight loss, night sweats, fevers, chills, fatigue, lassitude. HEENT:    headaches, difficulty swallowing, tooth/dental problems, sore throat,       sneezing, itching, ear ache, nasal congestion, post nasal drip, snoring CV:    chest pain, orthopnea, PND, swelling in lower extremities, anasarca,                                  dizziness, palpitations Resp:   shortness of breath with exertion or at rest.                productive cough,   non-productive cough, coughing up of blood.              change in color of mucus.  wheezing.   Skin:    rash or lesions. GI:  No-   heartburn, indigestion, abdominal pain, nausea, vomiting, diarrhea,                 change in bowel habits,  loss of appetite GU: dysuria, change in color of urine, no urgency or frequency.   flank pain. MS:   joint pain, stiffness, decreased range of motion, back pain. Neuro-     nothing unusual Psych:  change in mood or affect.  depression or anxiety.   memory loss.  OBJ- Physical Exam General- Alert, Oriented, Affect-appropriate, Distress- none acute, + not obese Skin- rash-none, lesions- none, excoriation- none Lymphadenopathy- none Head- atraumatic            Eyes- Gross vision intact, PERRLA, conjunctivae and secretions clear            Ears- Hearing, canals-normal            Nose- Clear, no-Septal dev, mucus, polyps, erosion, perforation             Throat- Mallampati III, mucosa clear , drainage- none, tonsils- atrophic, + teeth Neck- flexible , trachea midline, no stridor , thyroid nl, carotid no bruit Chest - symmetrical excursion , unlabored           Heart/CV- RRR , no murmur , no gallop  , no rub, nl s1 s2                           -  JVD- none , edema- none, stasis changes- none, varices- none           Lung- clear to P&A, wheeze- none, cough- none , dullness-none, rub- none           Chest wall-  Abd-  Br/ Gen/ Rectal- Not done, not indicated Extrem- cyanosis- none, clubbing, none, atrophy- none, strength- nl Neuro- grossly intact to observation

## 2022-07-21 DIAGNOSIS — G4733 Obstructive sleep apnea (adult) (pediatric): Secondary | ICD-10-CM | POA: Diagnosis not present

## 2022-07-22 ENCOUNTER — Encounter: Payer: Self-pay | Admitting: Internal Medicine

## 2022-07-22 ENCOUNTER — Ambulatory Visit: Payer: 59 | Admitting: Internal Medicine

## 2022-07-22 VITALS — BP 122/74 | HR 60 | Temp 97.7°F | Ht 65.0 in | Wt 158.2 lb

## 2022-07-22 DIAGNOSIS — G4733 Obstructive sleep apnea (adult) (pediatric): Secondary | ICD-10-CM | POA: Diagnosis not present

## 2022-07-22 DIAGNOSIS — H938X3 Other specified disorders of ear, bilateral: Secondary | ICD-10-CM | POA: Diagnosis not present

## 2022-07-22 NOTE — Patient Instructions (Addendum)
Order- DME [facility]- please service CPAP- Reduce range to 5-12  Order- refer to sleep center for mask fitting to reduce leak  Please let us know if we can help

## 2022-07-24 ENCOUNTER — Encounter: Payer: Self-pay | Admitting: Internal Medicine

## 2022-07-24 NOTE — Assessment & Plan Note (Signed)
She is trying to consistently use CPAP and I think if we can get the mask seal and comfort issues squared away she will do fine. Plan-reduce auto range to 5-12 and refer for mask fitting.  DME to service the machine.

## 2022-07-24 NOTE — Assessment & Plan Note (Signed)
She mentioned this at the end of the visit and hopefully reducing her pressure range a little will help her eustachian tube dysfunction as well.

## 2022-07-29 ENCOUNTER — Other Ambulatory Visit: Payer: Self-pay | Admitting: Primary Care

## 2022-07-29 DIAGNOSIS — R0982 Postnasal drip: Secondary | ICD-10-CM

## 2022-07-30 DIAGNOSIS — G4733 Obstructive sleep apnea (adult) (pediatric): Secondary | ICD-10-CM | POA: Diagnosis not present

## 2022-08-06 ENCOUNTER — Ambulatory Visit (HOSPITAL_BASED_OUTPATIENT_CLINIC_OR_DEPARTMENT_OTHER): Payer: 59 | Admitting: Physical Therapy

## 2022-08-06 NOTE — Progress Notes (Signed)
Bethany Kramme T. Ayron Fillinger, MD, [place] at [facility] [place], [address]  Phone: [phone]  FAX: [phone]  Bethany Sharp - 76 y.o. female  MRN [id number]  Date of Birth: 1946/08/30  Date: 08/07/2022  PCP: [name], NP  Referral: [name], NP  Chief Complaint  Patient presents with   Cough    With yellow phlegm since last Thursday   Sore Throat   Nasal Congestion   Headache   Subjective:   Bethany Sharp is a 76 y.o. very pleasant female patient with Body mass index is 26.48 kg/m. who presents with the following:  Pleasant patient, she presents with sinus congestion and cough and sinus pressure.  She has a deep cough is productive of yellowish sputum, she also has a headache, nasal congestion, runny nose, and somewhat of a sore throat.  Stuffed up, coughing up, yellowish phlegm.  Also some bad ha.  No fever.  Worried that could be contagious.   Many people is sick at home.  Children, granddaughter, and other relatives are all sick, with varying degrees of healing.  She does have an upcoming camping trip this weekend.  She is a smoker.  Review of Systems is noted in the HPI, as appropriate  Objective:   BP 110/70   Pulse 75   Temp 98.7 F (37.1 C) (Oral)   Ht '5\' 5"'$  (1.651 m)   Wt 159 lb 2 oz (72.2 kg)   BMI 26.48 kg/m    Gen: WDWN, NAD. Globally Non-toxic HEENT: Throat clear, w/o exudate, R TM clear, L TM - good landmarks, No fluid present. rhinnorhea.  MMM Frontal sinuses: NT Max sinuses: NT NECK: Anterior cervical  LAD is absent CV: RRR, No M/G/R, cap refill <2 sec PULM: Breathing comfortably in no respiratory distress. + B wheezing, crackles, rhonchi   Laboratory and Imaging Data:  Assessment and Plan:     ICD-10-CM   1. Acute bronchitis due to other specified organisms  J20.8     2. Acute cough  R05.1 POC COVID-19    3. Wheezing  R06.2      Greater than 1 week  of productive cough in a smoker who is actively wheezing.  I will give the patient some doxycycline and also give her 8 days of some oral prednisone.  Medication Management during today's office visit: Meds ordered this encounter  Medications   doxycycline (VIBRA-TABS) 100 MG tablet    Sig: Take 1 tablet (100 mg total) by mouth 2 (two) times daily.    Dispense:  20 tablet    Refill:  0   predniSONE (DELTASONE) 20 MG tablet    Sig: 2 tabs po for 4 days, then 1 tab po for 4 days    Dispense:  12 tablet    Refill:  0   There are no discontinued medications.  Orders placed today for conditions managed today: Orders Placed This Encounter  Procedures   POC COVID-19    Disposition: No follow-ups on file.  Dragon [employer] One speech-to-text software was used for transcription in this dictation.  Possible transcriptional errors can occur using Editor, commissioning.   Signed,  Maud Deed. Nazarene Bunning, MD   Outpatient Encounter Medications as of 08/07/2022  Medication Sig   ASPIRIN LOW DOSE 81 MG tablet TAKE 1 TABLET (81 MG TOTAL) BY MOUTH DAILY. SWALLOW WHOLE.   atorvastatin (LIPITOR) 20 MG tablet TAKE 1 TABLET BY MOUTH EVERY DAY  Carbinoxamine Maleate 4 MG TABS Take 1 tablet (4 mg total) by mouth 2 (two) times daily. For allergies.   doxycycline (VIBRA-TABS) 100 MG tablet Take 1 tablet (100 mg total) by mouth 2 (two) times daily.   fluticasone (FLONASE) 50 MCG/ACT nasal spray Place 1 spray into both nostrils 2 (two) times daily.   irbesartan (AVAPRO) 75 MG tablet TAKE 1 TABLET (75 MG TOTAL) BY MOUTH DAILY. FOR BLOOD PRESSURE.   isosorbide mononitrate (IMDUR) 30 MG 24 hr tablet TAKE 1/2 OF A TABLET (15 MG TOTAL) BY MOUTH DAILY   levothyroxine (SYNTHROID) 50 MCG tablet TAKE 1 TAB EVERY MORNING ON AN EMPTY STOMACH WITH WATER ONLY. NO FOOD OR OTHER MEDICATIONS X30 MINS.   methocarbamol (ROBAXIN) 500 MG tablet Take 1 tablet (500 mg total) by mouth 2 (two) times daily.   omeprazole (PRILOSEC) 40 MG  capsule TAKE 1 CAPSULE (40 MG TOTAL) BY MOUTH 2 (TWO) TIMES DAILY BEFORE A MEAL.   predniSONE (DELTASONE) 20 MG tablet 2 tabs po for 4 days, then 1 tab po for 4 days   sertraline (ZOLOFT) 50 MG tablet TAKE 1 TABLET (50 MG TOTAL) BY MOUTH DAILY. FOR ANXIETY.   levocetirizine (XYZAL) 5 MG tablet SMARTSIG:1 Tablet(s) By Mouth Every Evening   No facility-administered encounter medications on file as of 08/07/2022.

## 2022-08-07 ENCOUNTER — Encounter: Payer: Self-pay | Admitting: Family Medicine

## 2022-08-07 ENCOUNTER — Ambulatory Visit: Payer: 59 | Admitting: Family Medicine

## 2022-08-07 VITALS — BP 110/70 | HR 75 | Temp 98.7°F | Ht 65.0 in | Wt 159.1 lb

## 2022-08-07 DIAGNOSIS — R051 Acute cough: Secondary | ICD-10-CM | POA: Diagnosis not present

## 2022-08-07 DIAGNOSIS — J208 Acute bronchitis due to other specified organisms: Secondary | ICD-10-CM

## 2022-08-07 DIAGNOSIS — R062 Wheezing: Secondary | ICD-10-CM

## 2022-08-07 LAB — POC COVID19 BINAXNOW: SARS Coronavirus 2 Ag: NEGATIVE

## 2022-08-07 MED ORDER — DOXYCYCLINE HYCLATE 100 MG PO TABS: 100.0000 mg | ORAL_TABLET | Freq: Two times a day (BID) | ORAL | 0 refills | Status: DC

## 2022-08-07 MED ORDER — PREDNISONE 20 MG PO TABS: ORAL_TABLET | ORAL | 0 refills | Status: DC

## 2022-08-13 ENCOUNTER — Other Ambulatory Visit: Payer: Self-pay | Admitting: Primary Care

## 2022-08-13 DIAGNOSIS — J3089 Other allergic rhinitis: Secondary | ICD-10-CM

## 2022-08-20 ENCOUNTER — Other Ambulatory Visit (HOSPITAL_BASED_OUTPATIENT_CLINIC_OR_DEPARTMENT_OTHER): Payer: 59

## 2022-08-21 DIAGNOSIS — G4733 Obstructive sleep apnea (adult) (pediatric): Secondary | ICD-10-CM | POA: Diagnosis not present

## 2022-08-27 ENCOUNTER — Other Ambulatory Visit: Payer: Self-pay | Admitting: Primary Care

## 2022-08-27 DIAGNOSIS — F411 Generalized anxiety disorder: Secondary | ICD-10-CM

## 2022-08-31 ENCOUNTER — Other Ambulatory Visit: Payer: Self-pay | Admitting: Internal Medicine

## 2022-09-09 ENCOUNTER — Telehealth: Payer: Self-pay | Admitting: Orthopaedic Surgery

## 2022-09-09 NOTE — Telephone Encounter (Signed)
Pt requesting an appointment to have wrist checked again. Please call [phone]

## 2022-09-19 ENCOUNTER — Ambulatory Visit: Payer: 59 | Admitting: Primary Care

## 2022-09-20 DIAGNOSIS — G4733 Obstructive sleep apnea (adult) (pediatric): Secondary | ICD-10-CM | POA: Diagnosis not present

## 2022-09-25 ENCOUNTER — Ambulatory Visit (HOSPITAL_BASED_OUTPATIENT_CLINIC_OR_DEPARTMENT_OTHER): Payer: 59 | Admitting: Orthopaedic Surgery

## 2022-09-29 ENCOUNTER — Other Ambulatory Visit: Payer: Self-pay | Admitting: Primary Care

## 2022-09-29 DIAGNOSIS — F411 Generalized anxiety disorder: Secondary | ICD-10-CM

## 2022-10-02 ENCOUNTER — Ambulatory Visit (HOSPITAL_BASED_OUTPATIENT_CLINIC_OR_DEPARTMENT_OTHER): Payer: 59 | Admitting: Orthopaedic Surgery

## 2022-10-11 ENCOUNTER — Other Ambulatory Visit: Payer: Self-pay | Admitting: Primary Care

## 2022-10-11 DIAGNOSIS — J3089 Other allergic rhinitis: Secondary | ICD-10-CM

## 2022-10-11 DIAGNOSIS — I10 Essential (primary) hypertension: Secondary | ICD-10-CM

## 2022-10-16 ENCOUNTER — Ambulatory Visit (INDEPENDENT_AMBULATORY_CARE_PROVIDER_SITE_OTHER): Payer: 59 | Admitting: Orthopaedic Surgery

## 2022-10-16 ENCOUNTER — Ambulatory Visit (INDEPENDENT_AMBULATORY_CARE_PROVIDER_SITE_OTHER): Payer: 59

## 2022-10-16 DIAGNOSIS — S52572A Other intraarticular fracture of lower end of left radius, initial encounter for closed fracture: Secondary | ICD-10-CM | POA: Diagnosis not present

## 2022-10-16 DIAGNOSIS — S52612A Displaced fracture of left ulna styloid process, initial encounter for closed fracture: Secondary | ICD-10-CM | POA: Diagnosis not present

## 2022-10-16 DIAGNOSIS — S52502A Unspecified fracture of the lower end of left radius, initial encounter for closed fracture: Secondary | ICD-10-CM | POA: Diagnosis not present

## 2022-10-16 NOTE — Progress Notes (Signed)
Chief Complaint: Left wrist pain     History of Present Illness:   10/16/2022: Presents today for follow-up of her known left distal radius fracture.  Overall she is still having persistent pain about the wrist.  She feels like there is a block to motion.  This is limiting her from basic activities and gripping activities.    Surgical History:   None  PMH/PSH/Family History/Social History/Meds/Allergies:    Past Medical History:  Diagnosis Date   Allergies    Anal fissure    Anxiety    Arthritis    CAD (coronary artery disease)    Clotting disorder (HCC)    Colon polyps    Diverticulosis    GERD (gastroesophageal reflux disease)    HTN (hypertension)    Hyperlipidemia    Internal hemorrhoids    Sleep apnea    wears c-pap   Sleep apnea with use of continuous positive airway pressure (CPAP)    Thyroid disease    hypothyroidism   Tubular adenoma of colon    Past Surgical History:  Procedure Laterality Date   APPENDECTOMY  1960   CESAREAN SECTION     COLONOSCOPY     tummy tuck     Social History   Socioeconomic History   Marital status: Divorced    Spouse name: Not on file   Number of children: 1   Years of education: Not on file   Highest education level: Not on file  Occupational History   Not on file  Tobacco Use   Smoking status: Every Day    Packs/day: 0.25    Years: 57.00    Total pack years: 14.25    Types: Cigarettes    Start date: 1962   Smokeless tobacco: Never   Tobacco comments:    smokes about 4 cigs a day  Vaping Use   Vaping Use: Never used  Substance and Sexual Activity   Alcohol use: Yes    Comment: wine 2 glasses per day   Drug use: Never   Sexual activity: Not on file  Other Topics Concern   Not on file  Social History Narrative   Not on file   Social Determinants of Health   Financial Resource Strain: Not on file  Food Insecurity: Not on file  Transportation Needs: Not on file  Physical  Activity: Not on file  Stress: Not on file  Social Connections: Not on file   Family History  Problem Relation Age of Onset   Coronary artery disease Mother    Colon cancer Sister    Esophageal cancer Neg Hx    Rectal cancer Neg Hx    Stomach cancer Neg Hx    Pancreatic cancer Neg Hx    No Known Allergies Current Outpatient Medications  Medication Sig Dispense Refill   ASPIRIN LOW DOSE 81 MG tablet TAKE 1 TABLET (81 MG TOTAL) BY MOUTH DAILY. SWALLOW WHOLE. 90 tablet 3   atorvastatin (LIPITOR) 20 MG tablet TAKE 1 TABLET BY MOUTH EVERY DAY 30 tablet 8   Carbinoxamine Maleate 4 MG TABS Take 1 tablet (4 mg total) by mouth 2 (two) times daily. For allergies. 180 tablet 0   doxycycline (VIBRA-TABS) 100 MG tablet Take 1 tablet (100 mg total) by mouth 2 (two) times daily. 20 tablet 0   fluticasone (FLONASE)  50 MCG/ACT nasal spray Place 1 spray into both nostrils 2 (two) times daily. 16 g 5   irbesartan (AVAPRO) 75 MG tablet TAKE 1 TABLET (75 MG TOTAL) BY MOUTH DAILY. FOR BLOOD PRESSURE. 90 tablet 0   isosorbide mononitrate (IMDUR) 30 MG 24 hr tablet TAKE 1/2 OF A TABLET (15 MG TOTAL) BY MOUTH DAILY 15 tablet 8   levocetirizine (XYZAL) 5 MG tablet TAKE 1 TABLET BY MOUTH EVERY DAY IN THE EVENING FOR ALLERGIES 90 tablet 0   levothyroxine (SYNTHROID) 50 MCG tablet TAKE 1 TAB EVERY MORNING ON AN EMPTY STOMACH WITH WATER ONLY. NO FOOD OR OTHER MEDICATIONS X30 MINS. 90 tablet 0   methocarbamol (ROBAXIN) 500 MG tablet Take 1 tablet (500 mg total) by mouth 2 (two) times daily. 20 tablet 0   omeprazole (PRILOSEC) 40 MG capsule TAKE 1 CAPSULE (40 MG TOTAL) BY MOUTH 2 (TWO) TIMES DAILY BEFORE A MEAL. 180 capsule 3   predniSONE (DELTASONE) 20 MG tablet 2 tabs po for 4 days, then 1 tab po for 4 days 12 tablet 0   sertraline (ZOLOFT) 50 MG tablet TAKE 1 TABLET (50 MG TOTAL) BY MOUTH DAILY. FOR ANXIETY. 30 tablet 0   No current facility-administered medications for this visit.   No results found.  Review  of Systems:   A ROS was performed including pertinent positives and negatives as documented in the HPI.  Physical Exam :   Constitutional: NAD and appears stated age Neurological: Alert and oriented Psych: Appropriate affect and cooperative There were no vitals taken for this visit.   Comprehensive Musculoskeletal Exam:     She is able to fire anterior interosseous as well as posterior interosseous nerves.  Sensation intact in all divisions of the left hand.  Fingers warm and well-perfused.  Left wrist dorsiflexion is to 20 degrees as well as plantarflexion.  Tenderness about the SL ligament with a positive scaphoid shift Imaging:   Xray (3 views left wrist): There is a intra-articular distal radius fracture as well as ulnar styloid fracture.  Her fracture is healed with some dorsal angulation There is significant CMC arthritis involving the left hand.  There is some ulnar styloid abutment on the lunate.  There is widening of the SL   I personally reviewed and interpreted the radiographs.   Assessment:   77 y.o. female with left distal radius fracture after a fall on her deck.  Overall her fracture is well-healed at this time.  Unfortunately she does have evidence of an SL ligament injury with SL widening.  To this effect I do believe she would benefit from a further hand referral with Dr. Greta Doom to see if she is a candidate for surgical intervention.  Specifically I did discuss possibility of a proximal row carpectomy versus total wrist arthroplasty.  I would defer any surgical management to my colleague Dr. Greta Doom.  Will plan to refer her to him.    Plan :    -Plan for referral to Dr. Greta Doom   I personally saw and evaluated the patient, and participated in the management and treatment plan.  Bethany Mulders, MD Attending Physician, Orthopedic Surgery  This document was dictated using Dragon voice recognition software. A reasonable attempt at proof reading has been made to  minimize errors.

## 2022-10-18 ENCOUNTER — Encounter: Payer: Self-pay | Admitting: Primary Care

## 2022-10-18 ENCOUNTER — Ambulatory Visit: Payer: 59 | Admitting: Primary Care

## 2022-10-18 VITALS — BP 146/90 | HR 64 | Temp 98.2°F | Ht 65.0 in | Wt 160.0 lb

## 2022-10-18 DIAGNOSIS — D229 Melanocytic nevi, unspecified: Secondary | ICD-10-CM | POA: Diagnosis not present

## 2022-10-18 DIAGNOSIS — Z8249 Family history of ischemic heart disease and other diseases of the circulatory system: Secondary | ICD-10-CM | POA: Diagnosis not present

## 2022-10-18 DIAGNOSIS — K2100 Gastro-esophageal reflux disease with esophagitis, without bleeding: Secondary | ICD-10-CM | POA: Diagnosis not present

## 2022-10-18 DIAGNOSIS — Z0000 Encounter for general adult medical examination without abnormal findings: Secondary | ICD-10-CM | POA: Insufficient documentation

## 2022-10-18 DIAGNOSIS — I25118 Atherosclerotic heart disease of native coronary artery with other forms of angina pectoris: Secondary | ICD-10-CM

## 2022-10-18 DIAGNOSIS — R911 Solitary pulmonary nodule: Secondary | ICD-10-CM | POA: Diagnosis not present

## 2022-10-18 DIAGNOSIS — S52502S Unspecified fracture of the lower end of left radius, sequela: Secondary | ICD-10-CM

## 2022-10-18 DIAGNOSIS — I10 Essential (primary) hypertension: Secondary | ICD-10-CM | POA: Diagnosis not present

## 2022-10-18 DIAGNOSIS — Z0001 Encounter for general adult medical examination with abnormal findings: Secondary | ICD-10-CM | POA: Insufficient documentation

## 2022-10-18 DIAGNOSIS — G4733 Obstructive sleep apnea (adult) (pediatric): Secondary | ICD-10-CM | POA: Diagnosis not present

## 2022-10-18 DIAGNOSIS — J3089 Other allergic rhinitis: Secondary | ICD-10-CM

## 2022-10-18 DIAGNOSIS — F411 Generalized anxiety disorder: Secondary | ICD-10-CM | POA: Diagnosis not present

## 2022-10-18 DIAGNOSIS — E039 Hypothyroidism, unspecified: Secondary | ICD-10-CM

## 2022-10-18 DIAGNOSIS — E785 Hyperlipidemia, unspecified: Secondary | ICD-10-CM

## 2022-10-18 DIAGNOSIS — S52502A Unspecified fracture of the lower end of left radius, initial encounter for closed fracture: Secondary | ICD-10-CM | POA: Insufficient documentation

## 2022-10-18 HISTORY — DX: Unspecified fracture of the lower end of left radius, initial encounter for closed fracture: S52.502A

## 2022-10-18 NOTE — Assessment & Plan Note (Signed)
Asymptomatic.  Following with cardiology. Continue Imdur 30 mg daily, aspirin 81 mg daily, lipid control.

## 2022-10-18 NOTE — Assessment & Plan Note (Signed)
No evidence of stable lung nodule in chart. Will repeat CT chest without contrast for definitive answer. Orders placed.

## 2022-10-18 NOTE — Assessment & Plan Note (Signed)
She is taking levothyroxine incorrectly. Reminded her of the correct way to take levothyroxine.  Continue levothyroxine 50 mcg.  Repeat TSH pending.

## 2022-10-18 NOTE — Assessment & Plan Note (Addendum)
Declines all immunizations. Declines offer for mammogram as she completed this last year in [place]. Orders placed for bone density screening. Colonoscopy completed in 2023, due 2026.  Discussed the importance of a healthy diet and regular exercise in order for weight loss, and to reduce the risk of further co-morbidity.  Exam stable. Labs pending.  Follow up in 1 year for repeat physical.

## 2022-10-18 NOTE — Assessment & Plan Note (Addendum)
Following with GI, slightly improved. Continue omeprazole 40 mg twice daily. Office notes reviewed from September 2023.

## 2022-10-18 NOTE — Assessment & Plan Note (Addendum)
Following with pulmonology. Continue CPAP machine nightly. Provided information today regarding CPAP machine.

## 2022-10-18 NOTE — Assessment & Plan Note (Signed)
Following with orthopedics, office notes reviewed from January 2024. She is pending surgery for later this year.

## 2022-10-18 NOTE — Patient Instructions (Addendum)
Passez au laboratoire aujourd'hui.  Vous recevrez un appel tlphonique pour [employer] de vos poumons.  Vous recevrez [employer] sur MyChart concernant la rfrence en dermatologie.  Ravi de vous voir!  Stop by the lab today.  You will receive a phone call for the CT scan of your lungs.  You will receive a letter on MyChart regarding the referral to dermatology.  Nice to see you!

## 2022-10-18 NOTE — Assessment & Plan Note (Signed)
Controlled. Continue sertraline 50 mg daily.

## 2022-10-18 NOTE — Assessment & Plan Note (Signed)
Chronic and ongoing. Following with allergist.  Continue carbinoxamine maleate 4 mg BID, Xyzal 5 mg daily.

## 2022-10-18 NOTE — Progress Notes (Signed)
Subjective:    Patient ID: Bethany Sharp, female    DOB: October 02, 1946, 77 y.o.   MRN: [id number]  HPI  Bethany Sharp is a very pleasant 77 y.o. female  has a past medical history of Allergies, Anal fissure, Anxiety, Arthritis, CAD (coronary artery disease), Clotting disorder ([place]), Colon polyps, Diverticulosis, GERD (gastroesophageal reflux disease), HTN (hypertension), Hyperlipidemia, Internal hemorrhoids, Sleep apnea, Sleep apnea with use of continuous positive airway pressure (CPAP), Thyroid disease, and Tubular adenoma of colon. who presents today for complete physical for follow-up of chronic conditions.  Her translator joins [place] today.   Immunizations: -Tetanus: Declines  -Influenza: Declines  -Shingles: Declines  -Pneumonia: Declines   Diet: Fair diet.  Exercise: No regular exercise.  Eye exam: Completes annually  Dental exam: Completes semi-annually   Mammogram: Completed in [place] in 2023  Colonoscopy: Completed in 2023 Dexa: Completed in [place] years ago.   1) Hypertension/CAD: Currently managed on hyper Sartain 75 mg daily, isosorbide mononitrate 30 mg daily, atorvastatin 20 mg daily, aspirin 81 mg daily.  Following with cardiology, last office visit was in March 2023.  She is due for repeat lipid panel today.  BP Readings from Last 3 Encounters:  10/18/22 (!) 146/90  08/07/22 110/70  07/22/22 122/74     2) Allergic Rhinitis: Following with allergist and is managed on carbinoxamine maleate 4 mg twice daily, levocetirizine 5 mg daily. She continues to notice chronic post nasal drip, cough, and congestion.   3) OSA: Following with pulmonology, currently managed on CPAP machine.  Last office visit was in October 2023. Unfortunately, her CPAP machine is not functioning well most of the time so she does not use it. Her machine will work well for the first few days after she changes the mask, but after a few days the machine does not work well. She does sleep well when the  machine works well.   She questions which company her machine is from.    4) GERD: Following with GI, currently managed on omeprazole 40 mg twice daily.  Last office visit was in September 2023.  Pantoprazole 40 mg twice daily was ineffective and Dexilant was cost prohibitive.  It was recommended she elevate her bed when sleeping and work on weight loss.  5) GAD: Currently managed on sertraline 50 mg daily. Overall she feels well managed. She attempted to wean off sertraline by taking 1/2 tablet but felt better on the full tablet.   6) Hypothyroidism: Currently managed on levothyroxine 50 mcg daily.  Her last TSH was 0.37 in December 2022.  She is due for repeat TSH today.  She is taking levothyroxine every morning with her breakfast. She is aware of how to take her levothyroxine correctly. She has been taking her levothyroxine this way for years.   Review of Systems  Constitutional:  Negative for unexpected weight change.  HENT:  Positive for postnasal drip and rhinorrhea.   Eyes:  Negative for visual disturbance.  Respiratory:  Positive for cough. Negative for shortness of breath.   Cardiovascular:  Negative for chest pain.  Gastrointestinal:  Negative for constipation and diarrhea.  Genitourinary:  Negative for difficulty urinating.  Musculoskeletal:  Positive for arthralgias.  Skin:  Negative for rash.  Allergic/Immunologic: Positive for environmental allergies.  Neurological:  Negative for dizziness and headaches.  Psychiatric/Behavioral:  The patient is not nervous/anxious.          Past Medical History:  Diagnosis Date   Allergies    Anal fissure  Anxiety    Arthritis    CAD (coronary artery disease)    Clotting disorder (HCC)    Colon polyps    Diverticulosis    GERD (gastroesophageal reflux disease)    HTN (hypertension)    Hyperlipidemia    Internal hemorrhoids    Sleep apnea    wears c-pap   Sleep apnea with use of continuous positive airway pressure (CPAP)     Thyroid disease    hypothyroidism   Tubular adenoma of colon     Social History   Socioeconomic History   Marital status: Divorced    Spouse name: Not on file   Number of children: 1   Years of education: Not on file   Highest education level: Not on file  Occupational History   Not on file  Tobacco Use   Smoking status: Every Day    Packs/day: 0.25    Years: 57.00    Total pack years: 14.25    Types: Cigarettes    Start date: 1962   Smokeless tobacco: Never   Tobacco comments:    smokes about 4 cigs a day  Vaping Use   Vaping Use: Never used  Substance and Sexual Activity   Alcohol use: Yes    Comment: wine 2 glasses per day   Drug use: Never   Sexual activity: Not on file  Other Topics Concern   Not on file  Social History Narrative   Not on file   Social Determinants of Health   Financial Resource Strain: Not on file  Food Insecurity: Not on file  Transportation Needs: Not on file  Physical Activity: Not on file  Stress: Not on file  Social Connections: Not on file  Intimate Partner Violence: Not on file    Past Surgical History:  Procedure Laterality Date   APPENDECTOMY  1960   CESAREAN SECTION     COLONOSCOPY     tummy tuck      Family History  Problem Relation Age of Onset   Coronary artery disease Mother    Colon cancer Sister    Esophageal cancer Neg Hx    Rectal cancer Neg Hx    Stomach cancer Neg Hx    Pancreatic cancer Neg Hx     No Known Allergies  Current Outpatient Medications on File Prior to Visit  Medication Sig Dispense Refill   ASPIRIN LOW DOSE 81 MG tablet TAKE 1 TABLET (81 MG TOTAL) BY MOUTH DAILY. SWALLOW WHOLE. 90 tablet 3   atorvastatin (LIPITOR) 20 MG tablet TAKE 1 TABLET BY MOUTH EVERY DAY 30 tablet 8   Carbinoxamine Maleate 4 MG TABS Take 1 tablet (4 mg total) by mouth 2 (two) times daily. For allergies. 180 tablet 0   fluticasone (FLONASE) 50 MCG/ACT nasal spray Place 1 spray into both nostrils 2 (two) times  daily. 16 g 5   irbesartan (AVAPRO) 75 MG tablet TAKE 1 TABLET (75 MG TOTAL) BY MOUTH DAILY. FOR BLOOD PRESSURE. 90 tablet 0   isosorbide mononitrate (IMDUR) 30 MG 24 hr tablet TAKE 1/2 OF A TABLET (15 MG TOTAL) BY MOUTH DAILY 15 tablet 8   levocetirizine (XYZAL) 5 MG tablet TAKE 1 TABLET BY MOUTH EVERY DAY IN THE EVENING FOR ALLERGIES 90 tablet 0   levothyroxine (SYNTHROID) 50 MCG tablet TAKE 1 TAB EVERY MORNING ON AN EMPTY STOMACH WITH WATER ONLY. NO FOOD OR OTHER MEDICATIONS X30 MINS. 90 tablet 0   methocarbamol (ROBAXIN) 500 MG tablet Take 1 tablet (500 mg  total) by mouth 2 (two) times daily. 20 tablet 0   omeprazole (PRILOSEC) 40 MG capsule TAKE 1 CAPSULE (40 MG TOTAL) BY MOUTH 2 (TWO) TIMES DAILY BEFORE A MEAL. 180 capsule 3   sertraline (ZOLOFT) 50 MG tablet TAKE 1 TABLET (50 MG TOTAL) BY MOUTH DAILY. FOR ANXIETY. 30 tablet 0   doxycycline (VIBRA-TABS) 100 MG tablet Take 1 tablet (100 mg total) by mouth 2 (two) times daily. (Patient not taking: Reported on 10/18/2022) 20 tablet 0   predniSONE (DELTASONE) 20 MG tablet 2 tabs po for 4 days, then 1 tab po for 4 days (Patient not taking: Reported on 10/18/2022) 12 tablet 0   No current facility-administered medications on file prior to visit.    BP (!) 146/90   Pulse 64   Temp 98.2 F (36.8 C) (Temporal)   Ht '5\' 5"'$  (1.651 m)   Wt 160 lb (72.6 kg)   SpO2 98%   BMI 26.63 kg/m  Objective:   Physical Exam HENT:     Right Ear: Tympanic membrane and ear canal normal.     Left Ear: Tympanic membrane and ear canal normal.     Nose: Nose normal.  Eyes:     Conjunctiva/sclera: Conjunctivae normal.     Pupils: Pupils are equal, round, and reactive to light.  Neck:     Thyroid: No thyromegaly.  Cardiovascular:     Rate and Rhythm: Normal rate and regular rhythm.     Heart sounds: No murmur heard. Pulmonary:     Effort: Pulmonary effort is normal.     Breath sounds: Normal breath sounds. No rales.  Abdominal:     General: Bowel sounds are  normal.     Palpations: Abdomen is soft.     Tenderness: There is no abdominal tenderness.  Musculoskeletal:        General: Normal range of motion.     Cervical back: Neck supple.  Lymphadenopathy:     Cervical: No cervical adenopathy.  Skin:    General: Skin is warm and dry.     Findings: No rash.  Neurological:     Mental Status: She is alert and oriented to person, place, and time.     Cranial Nerves: No cranial nerve deficit.     Deep Tendon Reflexes: Reflexes are normal and symmetric.  Psychiatric:        Mood and Affect: Mood normal.           Assessment & Plan:  Coronary artery disease of native artery of native heart with stable angina pectoris [facility]) Assessment & Plan: Asymptomatic.  Following with cardiology. Continue Imdur 30 mg daily, aspirin 81 mg daily, lipid control.  Orders: -     Lipid panel  OSA (obstructive sleep apnea) Assessment & Plan: Following with pulmonology. Continue CPAP machine nightly. Provided information today regarding CPAP machine.   Gastroesophageal reflux disease with esophagitis without hemorrhage Assessment & Plan: Following with GI, slightly improved. Continue omeprazole 40 mg twice daily. Office notes reviewed from September 2023.   Hypothyroidism, unspecified type Assessment & Plan: She is taking levothyroxine incorrectly. Reminded her of the correct way to take levothyroxine.  Continue levothyroxine 50 mcg.  Repeat TSH pending.  Orders: -     TSH  Environmental and seasonal allergies Assessment & Plan: Chronic and ongoing. Following with allergist.  Continue carbinoxamine maleate 4 mg BID, Xyzal 5 mg daily.    Lung nodule Assessment & Plan: No evidence of stable lung nodule in chart. Will repeat  CT chest without contrast for definitive answer. Orders placed.  Orders: -     CT CHEST WO CONTRAST; Future  Multiple nevi -     Ambulatory referral to Dermatology  Essential hypertension -      Comprehensive metabolic panel  Family history of aortic dissection Assessment & Plan: Following with cardiology.    Hyperlipidemia, unspecified hyperlipidemia type Assessment & Plan: Repeat lipid panel pending. Continue atorvastatin 20 mg daily.   GAD (generalized anxiety disorder) Assessment & Plan: Controlled. Continue sertraline 50 mg daily.   Preventative health care Assessment & Plan: Declines all immunizations. Declines offer for mammogram as she completed this last year in [place]. Orders placed for bone density screening. Colonoscopy completed in 2023, due 2026.  Discussed the importance of a healthy diet and regular exercise in order for weight loss, and to reduce the risk of further co-morbidity.  Exam stable. Labs pending.  Follow up in 1 year for repeat physical.    Closed fracture of distal end of left radius, unspecified fracture morphology, sequela Assessment & Plan: Following with orthopedics, office notes reviewed from January 2024. She is pending surgery for later this year.         [name], NP

## 2022-10-18 NOTE — Assessment & Plan Note (Signed)
Repeat lipid panel pending.  Continue atorvastatin 20 mg daily. 

## 2022-10-18 NOTE — Assessment & Plan Note (Signed)
Following with cardiology. 

## 2022-10-19 LAB — COMPREHENSIVE METABOLIC PANEL
AG Ratio: 2.4 (calc) (ref 1.0–2.5)
ALT: 15 U/L (ref 6–29)
AST: 17 U/L (ref 10–35)
Albumin: 4.5 g/dL (ref 3.6–5.1)
Alkaline phosphatase (APISO): 84 U/L (ref 37–153)
BUN: 11 mg/dL (ref 7–25)
CO2: 22 mmol/L (ref 20–32)
Calcium: 9.2 mg/dL (ref 8.6–10.4)
Chloride: 103 mmol/L (ref 98–110)
Creat: 0.75 mg/dL (ref 0.60–1.00)
Globulin: 1.9 g/dL (calc) (ref 1.9–3.7)
Glucose, Bld: 95 mg/dL (ref 65–99)
Potassium: 4.2 mmol/L (ref 3.5–5.3)
Sodium: 138 mmol/L (ref 135–146)
Total Bilirubin: 0.6 mg/dL (ref 0.2–1.2)
Total Protein: 6.4 g/dL (ref 6.1–8.1)

## 2022-10-19 LAB — TSH: TSH: 1.15 mIU/L (ref 0.40–4.50)

## 2022-10-19 LAB — LIPID PANEL
Cholesterol: 176 mg/dL (ref <200)
HDL: 93 mg/dL (ref >=50)
LDL Cholesterol (Calc): 56 mg/dL (calc)
Non-HDL Cholesterol (Calc): 83 mg/dL (calc) (ref <130)
Total CHOL/HDL Ratio: 1.9 (calc) (ref <5.0)
Triglycerides: 207 mg/dL — ABNORMAL HIGH (ref <150)

## 2022-10-21 DIAGNOSIS — G4733 Obstructive sleep apnea (adult) (pediatric): Secondary | ICD-10-CM | POA: Diagnosis not present

## 2022-10-23 ENCOUNTER — Other Ambulatory Visit: Payer: Self-pay | Admitting: Primary Care

## 2022-10-23 DIAGNOSIS — F411 Generalized anxiety disorder: Secondary | ICD-10-CM

## 2022-10-31 DIAGNOSIS — G4733 Obstructive sleep apnea (adult) (pediatric): Secondary | ICD-10-CM | POA: Diagnosis not present

## 2022-11-06 DIAGNOSIS — S52502A Unspecified fracture of the lower end of left radius, initial encounter for closed fracture: Secondary | ICD-10-CM | POA: Diagnosis not present

## 2022-11-06 DIAGNOSIS — M13832 Other specified arthritis, left wrist: Secondary | ICD-10-CM | POA: Diagnosis not present

## 2022-11-11 DIAGNOSIS — E2839 Other primary ovarian failure: Secondary | ICD-10-CM

## 2022-11-20 ENCOUNTER — Ambulatory Visit
Admission: RE | Admit: 2022-11-20 | Discharge: 2022-11-20 | Disposition: A | Discharge status: Home / Self Care | Payer: 59 | Source: Ambulatory Visit | Attending: Primary Care | Admitting: Primary Care

## 2022-11-20 ENCOUNTER — Other Ambulatory Visit: Payer: Self-pay | Admitting: Primary Care

## 2022-11-20 DIAGNOSIS — R918 Other nonspecific abnormal finding of lung field: Secondary | ICD-10-CM | POA: Diagnosis not present

## 2022-11-20 DIAGNOSIS — I700 Atherosclerosis of aorta: Secondary | ICD-10-CM | POA: Diagnosis not present

## 2022-11-20 DIAGNOSIS — R911 Solitary pulmonary nodule: Secondary | ICD-10-CM | POA: Diagnosis not present

## 2022-11-20 DIAGNOSIS — E039 Hypothyroidism, unspecified: Secondary | ICD-10-CM

## 2022-11-21 DIAGNOSIS — G4733 Obstructive sleep apnea (adult) (pediatric): Secondary | ICD-10-CM | POA: Diagnosis not present

## 2022-12-07 ENCOUNTER — Other Ambulatory Visit: Payer: Self-pay | Admitting: Primary Care

## 2022-12-07 DIAGNOSIS — I10 Essential (primary) hypertension: Secondary | ICD-10-CM

## 2022-12-19 DIAGNOSIS — Z85828 Personal history of other malignant neoplasm of skin: Secondary | ICD-10-CM | POA: Diagnosis not present

## 2022-12-19 DIAGNOSIS — D1801 Hemangioma of skin and subcutaneous tissue: Secondary | ICD-10-CM | POA: Diagnosis not present

## 2022-12-19 DIAGNOSIS — D692 Other nonthrombocytopenic purpura: Secondary | ICD-10-CM | POA: Diagnosis not present

## 2022-12-19 DIAGNOSIS — C44519 Basal cell carcinoma of skin of other part of trunk: Secondary | ICD-10-CM | POA: Diagnosis not present

## 2022-12-19 DIAGNOSIS — D225 Melanocytic nevi of trunk: Secondary | ICD-10-CM | POA: Diagnosis not present

## 2022-12-19 DIAGNOSIS — L820 Inflamed seborrheic keratosis: Secondary | ICD-10-CM | POA: Diagnosis not present

## 2022-12-19 DIAGNOSIS — L821 Other seborrheic keratosis: Secondary | ICD-10-CM | POA: Diagnosis not present

## 2022-12-19 DIAGNOSIS — D2361 Other benign neoplasm of skin of right upper limb, including shoulder: Secondary | ICD-10-CM | POA: Diagnosis not present

## 2022-12-19 DIAGNOSIS — L570 Actinic keratosis: Secondary | ICD-10-CM | POA: Diagnosis not present

## 2022-12-19 DIAGNOSIS — L814 Other melanin hyperpigmentation: Secondary | ICD-10-CM | POA: Diagnosis not present

## 2023-01-02 ENCOUNTER — Other Ambulatory Visit: Payer: 59

## 2023-01-26 ENCOUNTER — Other Ambulatory Visit: Payer: Self-pay | Admitting: Internal Medicine

## 2023-01-31 DIAGNOSIS — G4733 Obstructive sleep apnea (adult) (pediatric): Secondary | ICD-10-CM | POA: Diagnosis not present

## 2023-02-05 ENCOUNTER — Other Ambulatory Visit: Payer: Self-pay | Admitting: Primary Care

## 2023-02-05 DIAGNOSIS — J3089 Other allergic rhinitis: Secondary | ICD-10-CM

## 2023-02-26 ENCOUNTER — Other Ambulatory Visit: Payer: Self-pay | Admitting: Internal Medicine

## 2023-03-17 DIAGNOSIS — M13832 Other specified arthritis, left wrist: Secondary | ICD-10-CM | POA: Diagnosis not present

## 2023-03-17 DIAGNOSIS — M1812 Unilateral primary osteoarthritis of first carpometacarpal joint, left hand: Secondary | ICD-10-CM | POA: Diagnosis not present

## 2023-03-17 DIAGNOSIS — S52502A Unspecified fracture of the lower end of left radius, initial encounter for closed fracture: Secondary | ICD-10-CM | POA: Diagnosis not present

## 2023-03-26 ENCOUNTER — Other Ambulatory Visit: Payer: Self-pay | Admitting: Internal Medicine

## 2023-03-31 ENCOUNTER — Telehealth: Payer: Self-pay | Admitting: Primary Care

## 2023-03-31 NOTE — Telephone Encounter (Signed)
Called and spoke with patient son, advised patients cardiologist prescribes this medication. He will reach out to them.

## 2023-03-31 NOTE — Telephone Encounter (Signed)
Prescription Request  03/31/2023  LOV: 10/18/2022  What is the name of the medication or equipment? atorvastatin (LIPITOR) 20 MG tablet, patient leaving for [place] on Wednesday.   Have you contacted your pharmacy to request a refill? Yes   Which pharmacy would you like this sent to?  [id number] - [place], [place] - [address] AT [facility] [address] [place] [address] Phone: [phone] Fax: [phone]    Patient notified that their request is being sent to the clinical staff for review and that they should receive a response within 2 business days.   Please advise at [facility] [phone]

## 2023-04-04 ENCOUNTER — Other Ambulatory Visit: Payer: Self-pay | Admitting: Internal Medicine

## 2023-04-09 ENCOUNTER — Other Ambulatory Visit (HOSPITAL_BASED_OUTPATIENT_CLINIC_OR_DEPARTMENT_OTHER): Payer: Self-pay | Admitting: Internal Medicine

## 2023-04-26 ENCOUNTER — Other Ambulatory Visit (HOSPITAL_BASED_OUTPATIENT_CLINIC_OR_DEPARTMENT_OTHER): Payer: Self-pay | Admitting: Internal Medicine

## 2023-05-02 DIAGNOSIS — G4733 Obstructive sleep apnea (adult) (pediatric): Secondary | ICD-10-CM | POA: Diagnosis not present

## 2023-05-20 ENCOUNTER — Other Ambulatory Visit: Payer: Self-pay | Admitting: Internal Medicine

## 2023-05-20 MED ORDER — ATORVASTATIN CALCIUM 20 MG PO TABS: 20.0000 mg | ORAL_TABLET | Freq: Every day | ORAL | 0 refills | Status: DC

## 2023-05-21 ENCOUNTER — Telehealth: Payer: Self-pay | Admitting: Primary Care

## 2023-05-21 NOTE — Telephone Encounter (Addendum)
-----   Message from Doreene Nest sent at 11/25/2022  5:17 PM EST ----- Regarding: repeat CT chest   Please call patient:  She is due for repeat CT chest either now, or we can defer until February 2025. This is to re-evaluate the lung nodule seen on CT scan from February 2024.  Let me know if she'd like to have this now or in February 2025. Which location? [place] or [place]?

## 2023-05-22 NOTE — Telephone Encounter (Signed)
Unable to reach patients son. Left voicemail to return call to our office.  

## 2023-05-26 NOTE — Telephone Encounter (Signed)
Spoke to son. On dpr. He will call mom now and see where and when she wants to have done. He will call office back to et [place] know.

## 2023-05-26 NOTE — Telephone Encounter (Signed)
Patient son called back would like to hold off until February for appointment.

## 2023-05-26 NOTE — Telephone Encounter (Signed)
Noted  

## 2023-05-31 ENCOUNTER — Other Ambulatory Visit: Payer: Self-pay | Admitting: Internal Medicine

## 2023-06-05 ENCOUNTER — Inpatient Hospital Stay: Admission: RE | Admit: 2023-06-05 | Payer: 59 | Source: Ambulatory Visit

## 2023-06-10 ENCOUNTER — Telehealth: Payer: Self-pay | Admitting: Internal Medicine

## 2023-06-10 ENCOUNTER — Other Ambulatory Visit: Payer: Self-pay | Admitting: Primary Care

## 2023-06-10 DIAGNOSIS — E2839 Other primary ovarian failure: Secondary | ICD-10-CM

## 2023-06-10 MED ORDER — ATORVASTATIN CALCIUM 20 MG PO TABS: 20.0000 mg | ORAL_TABLET | Freq: Every day | ORAL | 0 refills | Status: DC

## 2023-06-10 MED ORDER — IRBESARTAN 75 MG PO TABS: 75.0000 mg | ORAL_TABLET | Freq: Every day | ORAL | 0 refills | Status: DC

## 2023-06-10 NOTE — Telephone Encounter (Signed)
T's medications were sent to pt's pharmacy as requested. Confirmation received.

## 2023-06-10 NOTE — Telephone Encounter (Signed)
*  STAT* If patient is at the pharmacy, call can be transferred to refill team.   1. Which medications need to be refilled? (please list name of each medication and dose if known) atorvastatin (LIPITOR) 20 MG tablet ; irbesartan (AVAPRO) 75 MG tablet    2. Would you like to learn more about the convenience, safety, & potential cost savings by using the [facility]?      3. Are you open to using the [facility] (Type [facility]. No).   4. Which pharmacy/location (including street and city if local pharmacy) is medication to be sent to? [id number] - [place], [place] - [address] AT [address]NER OF [facility] [address] [[id number]]    5. Do they need a 30 day or 90 day supply? 90

## 2023-06-10 NOTE — Telephone Encounter (Signed)
*  STAT* If patient is at the pharmacy, call can be transferred to refill team.   1. Which medications need to be refilled? (please list name of each medication and dose if known) atorvastatin (LIPITOR) 20 MG tablet   irbesartan (AVAPRO) 75 MG tablet    2. Would you like to learn more about the convenience, safety, & potential cost savings by using the [facility]? No    3. Are you open to using the [facility] ([facility].) No   4. Which pharmacy/location (including street and city if local pharmacy) is medication to be sent to?  [id number] - [place], [place] - [address] AT [address]NER OF [address]     5. Do they need a 30 day or 90 day supply? 90 day   ** Previous request had incorrect pharmacy on it**  Pt has scheduled appt for 06/13/23 and is out of medication

## 2023-06-12 NOTE — Progress Notes (Signed)
Cardiology Office Note:    Date:  06/13/2023   ID:  Bethany Sharp, DOB May 07, 1946, MRN [id number]  PCP:  Bethany Nest, NP   [facility]  Cardiologist:  Bethany Lam MD Advanced Practice Provider:  No care team member to display Electrophysiologist:  None      CC: CAD f/u Interpretor Service used  History of Present Illness:    Bethany Sharp is a 77 y.o. female with a hx of HLD, history of tobacco abuse (distant) , and family history of aortic dissection who presents for evaluation  2022: asymptomatic SVT and positive CCTA; felt better and did not want cath.  Planned for medical therapy. 2023: Went to [place].  Down to 1 cigarette a day  Patient notes that she is doing well.   Since last visit notes no issues  . There are no interval hospital/ED visit.   EKG showed Normal sinus rhythm  No chest pain or pressure .  No SOB/DOE and no PND/Orthopnea.  No weight gain or leg swelling.  No palpitations or syncope.    Up to 2-3 cigarettes a day  Aorta normal caliber 2024.    Past Medical History:  Diagnosis Date   Allergies    Anal fissure    Anxiety    Arthritis    CAD (coronary artery disease)    Clotting disorder (HCC)    Colon polyps    Diverticulosis    GERD (gastroesophageal reflux disease)    HTN (hypertension)    Hyperlipidemia    Internal hemorrhoids    Sleep apnea    wears c-pap   Sleep apnea with use of continuous positive airway pressure (CPAP)    Thyroid disease    hypothyroidism   Tubular adenoma of colon     Past Surgical History:  Procedure Laterality Date   APPENDECTOMY  1960   CESAREAN SECTION     COLONOSCOPY     tummy tuck      Current Medications: Current Meds  Medication Sig   aspirin EC (ASPIRIN LOW DOSE) 81 MG tablet TAKE 1 TABLET (81 MG TOTAL) BY MOUTH DAILY. SWALLOW WHOLE.   Carbinoxamine Maleate 4 MG TABS Take 1 tablet (4 mg total) by mouth 2 (two) times daily. For allergies.   fluticasone (FLONASE)  50 MCG/ACT nasal spray Place 1 spray into both nostrils 2 (two) times daily.   irbesartan (AVAPRO) 75 MG tablet Take 1 tablet (75 mg total) by mouth daily. For blood pressure.   levocetirizine (XYZAL) 5 MG tablet TAKE 1 TABLET BY MOUTH EVERY DAY IN THE EVENING FOR ALLERGIES   levothyroxine (SYNTHROID) 50 MCG tablet TAKE 1 TAB EVERY MORNING ON AN EMPTY STOMACH WITH WATER ONLY. NO FOOD OR OTHER MEDICATIONS X30 MINS.   methocarbamol (ROBAXIN) 500 MG tablet Take 1 tablet (500 mg total) by mouth 2 (two) times daily.   omeprazole (PRILOSEC) 40 MG capsule TAKE 1 CAPSULE (40 MG TOTAL) BY MOUTH 2 (TWO) TIMES DAILY BEFORE A MEAL.   predniSONE (DELTASONE) 20 MG tablet 2 tabs po for 4 days, then 1 tab po for 4 days   sertraline (ZOLOFT) 50 MG tablet TAKE 1 TABLET (50 MG TOTAL) BY MOUTH DAILY. FOR ANXIETY.   [DISCONTINUED] atorvastatin (LIPITOR) 20 MG tablet Take 1 tablet (20 mg total) by mouth daily.   [DISCONTINUED] isosorbide mononitrate (IMDUR) 30 MG 24 hr tablet TAKE 1/2 OF A TABLET (15 MG TOTAL) BY MOUTH DAILY     Allergies:   Patient has no  known allergies.   Social History   Socioeconomic History   Marital status: Divorced    Spouse name: Not on file   Number of children: 1   Years of education: Not on file   Highest education level: Not on file  Occupational History   Not on file  Tobacco Use   Smoking status: Every Day    Current packs/day: 0.25    Average packs/day: 0.3 packs/day for 62.7 years (15.7 ttl pk-yrs)    Types: Cigarettes    Start date: 1962   Smokeless tobacco: Never   Tobacco comments:    smokes about 4 cigs a day  Vaping Use   Vaping status: Never Used  Substance and Sexual Activity   Alcohol use: Yes    Comment: wine 2 glasses per day   Drug use: Never   Sexual activity: Not on file  Other Topics Concern   Not on file  Social History Narrative   Not on file   Social Determinants of Health   Financial Resource Strain: Not on file  Food Insecurity: Not on  file  Transportation Needs: Not on file  Physical Activity: Not on file  Stress: Not on file  Social Connections: Not on file    Social: patient presents with son, Bethany Sharp, Goes  back to [place] for one month  Family History: Grandfather died at age 64 from PE Grandmother died at age 20 from an aortic dissection. Mother had a dissection. Sister died from colon cancer. Mother had   ROS:   Please see the history of present illness.     EKGs/Labs/Other Studies Reviewed:    The following studies were reviewed today:  EKG:   12/07/20: SR 64  Cardiac Studies & Procedures         MONITORS  LONG TERM MONITOR (3-14 DAYS) 01/16/2021  Narrative  Patient had a minimum heart rate of 46 bpm, maximum heart rate of 207 bpm, and average heart rate of 68 bpm.  Predominant underlying rhythm was sinus rhythm.  One run of non-sustained ventricular tachycardia occurred lasting 8 beats at longest with a max rate of 207 bpm at fastest.  Multiple (~ 380) runs of supraventricular tachycardia occurred lasting 35 seconds at longest with a max rate of 95 bpm at fastest.  Isolated PACs were occasional (1.3%), with rare couplets and triplets present.  Isolated PVCs were rare (<1.0%), with rare couplets.  Triggered and diary events associated with NSVT and PVCs.  Has many short runs of asymptomatic SVT with symptomatic PVCs and NSVT.   CT SCANS  CT CORONARY MORPH W/CTA COR W/SCORE 01/08/2021  Addendum 01/08/2021  8:03 PM ADDENDUM REPORT: 01/08/2021 20:01  CLINICAL DATA:  77 year old female with dyspnea  EXAM: Cardiac/Coronary  CTA  TECHNIQUE: The patient was scanned on a [employer].  FINDINGS: A 120 kV prospective scan was triggered in the descending thoracic aorta at 111 HU's. Axial non-contrast 3 mm slices were carried out through the heart. The data set was analyzed on a dedicated work station and scored using the Agatson method. Gantry rotation speed was 250 msecs and  collimation was .6 mm. Beta blockade and 0.8 mg of sl NTG was given. The 3D data set was reconstructed in 5% intervals of the 67-82 % of the R-R cycle. Diastolic phases were analyzed on a dedicated work station using MPR, MIP and VRT modes. The patient received 80 cc of contrast.  Aorta:  Normal size.  No calcifications.  No dissection.  Aortic Valve:  Trileaflet.  No calcifications.  Coronary Arteries:  Normal coronary origin.  Right dominance.  RCA is a large dominant artery that gives rise to PDA and PLA. There is no plaque.  Left main is a large artery that gives rise to LAD and LCX arteries.  LAD is a large vessel that has mid calcified plaque at bifurcation of second diagonal branch. There is step artifact that makes interpretation of the vessel challenging. The ostium of the diagonal appears severely stenotic.  LCX is a non-dominant artery that gives rise to one large OM1 branch. There is no plaque.  Other findings:  Normal pulmonary vein drainage into the left atrium.  Normal left atrial appendage without a thrombus.  Normal size of the pulmonary artery.  Please see radiology report for non cardiac findings.  IMPRESSION: 1. Coronary calcium score of 74. This was 26 percentile for age and sex matched control.  2. Normal coronary origin with right dominance.  3. Mid LAD calcified plaque at bifurcation with diagonal. Possible severe ostial disease of diagonal branch (stair step artifact markedly reduces quality of study). Will attempt FFR analysis. If symptoms worsen or become more worrisome, recommend further cardiac testing.   Electronically Signed By: Donato Schultz MD On: 01/08/2021 20:01  Narrative EXAM: OVER-READ INTERPRETATION  CT CHEST  The following report is an over-read performed by radiologist Dr. Irish Lack of [facility] on 01/08/2021. This over-read does not include interpretation of cardiac or coronary anatomy or pathology.  The coronary CTA interpretation by the cardiologist is attached.  COMPARISON:  None.  FINDINGS: Vascular: No significant vascular findings. Normal heart size. No pericardial effusion.  Mediastinum/Nodes: Visualized mediastinum and hilar regions demonstrate no lymphadenopathy or masses.  Lungs/Pleura: Less images demonstrate calcification along the right posteromedial lung base near the diaphragm consistent with a calcified granuloma. Visualized lungs show no evidence of pulmonary edema, consolidation, pneumothorax or pleural fluid.  Upper Abdomen: No acute abnormality.  Musculoskeletal: No chest wall mass or suspicious bone lesions identified.  IMPRESSION: No significant incidental findings. Calcified granuloma along the right posteromedial lung base near the diaphragm.  Electronically Signed: By: Irish Lack M.D. On: 01/08/2021 17:00           Recent Labs: 10/18/2022: ALT 15; BUN 11; Creat 0.75; Potassium 4.2; Sodium 138; TSH 1.15  Recent Lipid Panel    Component Value Date/Time   CHOL 176 10/18/2022 1513   CHOL 189 05/07/2021 0941   TRIG 207 (H) 10/18/2022 1513   HDL 93 10/18/2022 1513   HDL 93 05/07/2021 0941   CHOLHDL 1.9 10/18/2022 1513   VLDL 24.8 09/19/2021 1103   LDLCALC 56 10/18/2022 1513   LDLDIRECT 132.0 12/07/2020 1620     Physical Exam:    VS:  BP 120/68   Pulse 71   Ht 5\' 5"  (1.651 m)   Wt 160 lb 12.8 oz (72.9 kg)   SpO2 96%   BMI 26.76 kg/m     Wt Readings from Last 3 Encounters:  06/13/23 160 lb 12.8 oz (72.9 kg)  10/18/22 160 lb (72.6 kg)  08/07/22 159 lb 2 oz (72.2 kg)   Gen: No distress   Neck: No JVD Cardiac: No Rubs or Gallops, no Murmur, RRR +2 radial pulses Respiratory: Clear to auscultation bilaterally, normal effort, normal  respiratory rate GI: Soft, nontender, non-distended  MS: No  edema;  moves all extremities Integument: Skin feels warm Neuro:  At time of evaluation, alert and oriented to  person/place/time/situation  Psych: Normal affect, patient feels well   ASSESSMENT:    1. Coronary artery disease of native artery of native heart with stable angina pectoris (HCC)   2. SVT (supraventricular tachycardia)   3. Essential hypertension   4. Tobacco abuse   5. Family history of aortic dissection   6. Hyperlipidemia, unspecified hyperlipidemia type     PLAN:    Coronary Artery Disease Non obstructive Family history of AA and dissection (mother) - asymptomatic  - anatomy: mLAD and D1 disease  - continue ASA 81 mg - continue atorvastatin 10 mg, goal LDL ~ 55 - No AA or dilation (reviewed 2024 CT with her) - well controlled of medical therapy - will refill meds for 1 year + three months   Tobacco Abuse - 2-3 cigarette a day - pre contemplative to quit; gave education  PSVT - asymptomatic  One year me     Medication Adjustments/Labs and Tests Ordered: Current medicines are reviewed at length with the patient today.  Concerns regarding medicines are outlined above.  Orders Placed This Encounter  Procedures   EKG 12-Lead    Meds ordered this encounter  Medications   DISCONTD: atorvastatin (LIPITOR) 20 MG tablet    Sig: Take 1 tablet (20 mg total) by mouth daily.    Dispense:  90 tablet    Refill:  3    Pt must keep upcoming appt in Dr. Izora Ribas in August 2024 before anymore refills. Thank you Final Attempt   DISCONTD: isosorbide mononitrate (IMDUR) 30 MG 24 hr tablet    Sig: Take 1 tablet (30 mg total) by mouth daily.    Dispense:  45 tablet    Refill:  3    Please call our office to schedule an overdue appointment with Dr. Izora Ribas before anymore refills. [phone]. Thank you 2nd attempt   atorvastatin (LIPITOR) 20 MG tablet    Sig: Take 1 tablet (20 mg total) by mouth daily.    Dispense:  90 tablet    Refill:  4    Pt must keep upcoming appt in Dr. Izora Ribas in August 2024 before anymore refills. Thank you Final Attempt   isosorbide  mononitrate (IMDUR) 30 MG 24 hr tablet    Sig: Take 1 tablet (30 mg total) by mouth daily.    Dispense:  45 tablet    Refill:  4    Please call our office to schedule an overdue appointment with Dr. Izora Ribas before anymore refills. [phone]. Thank you 2nd attempt     Patient Instructions  Medication Instructions:  Your physician recommends that you continue on your current medications as directed. Please refer to the Current Medication list given to you today. REFILLED: atorvastatin and isosorbide mononitrate  *If you need a refill on your cardiac medications before your next appointment, please call your pharmacy*   Lab Work: NONE If you have labs (blood work) drawn today and your tests are completely normal, you will receive your results only by: MyChart Message (if you have MyChart) OR A paper copy in the mail If you have any lab test that is abnormal or we need to change your treatment, we will call you to review the results.   Testing/Procedures: NONE   Follow-Up: At [facility], you and your health needs are our priority.  As part of our continuing mission to provide you with exceptional heart care, we have created designated Provider Care Teams.  These Care Teams include your primary Cardiologist (physician) and Advanced Practice Providers (APPs -  Physician Assistants and Nurse Practitioners) who all work together to provide you with the care you need, when you need it.   Your next appointment:   1 year(s)  Provider:   Christell Constant, MD        Signed, Christell Constant, MD  06/13/2023 11:11 AM    [facility]

## 2023-06-13 ENCOUNTER — Ambulatory Visit: Payer: 59 | Attending: Internal Medicine | Admitting: Internal Medicine

## 2023-06-13 ENCOUNTER — Encounter: Payer: Self-pay | Admitting: Internal Medicine

## 2023-06-13 VITALS — BP 120/68 | HR 71 | Ht 65.0 in | Wt 160.8 lb

## 2023-06-13 DIAGNOSIS — Z720 Tobacco use: Secondary | ICD-10-CM

## 2023-06-13 DIAGNOSIS — I10 Essential (primary) hypertension: Secondary | ICD-10-CM

## 2023-06-13 DIAGNOSIS — Z8249 Family history of ischemic heart disease and other diseases of the circulatory system: Secondary | ICD-10-CM

## 2023-06-13 DIAGNOSIS — E785 Hyperlipidemia, unspecified: Secondary | ICD-10-CM

## 2023-06-13 DIAGNOSIS — I25118 Atherosclerotic heart disease of native coronary artery with other forms of angina pectoris: Secondary | ICD-10-CM

## 2023-06-13 DIAGNOSIS — I471 Supraventricular tachycardia, unspecified: Secondary | ICD-10-CM

## 2023-06-13 MED ORDER — ISOSORBIDE MONONITRATE ER 30 MG PO TB24: 30.0000 mg | ORAL_TABLET | Freq: Every day | ORAL | 3 refills | Status: DC

## 2023-06-13 MED ORDER — ATORVASTATIN CALCIUM 20 MG PO TABS: 20.0000 mg | ORAL_TABLET | Freq: Every day | ORAL | 4 refills | Status: DC

## 2023-06-13 MED ORDER — ISOSORBIDE MONONITRATE ER 30 MG PO TB24: 30.0000 mg | ORAL_TABLET | Freq: Every day | ORAL | 4 refills | Status: DC

## 2023-06-13 MED ORDER — ATORVASTATIN CALCIUM 20 MG PO TABS: 20.0000 mg | ORAL_TABLET | Freq: Every day | ORAL | 3 refills | Status: DC

## 2023-06-13 NOTE — Patient Instructions (Signed)
Medication Instructions:  Your physician recommends that you continue on your current medications as directed. Please refer to the Current Medication list given to you today. REFILLED: atorvastatin and isosorbide mononitrate  *If you need a refill on your cardiac medications before your next appointment, please call your pharmacy*   Lab Work: NONE If you have labs (blood work) drawn today and your tests are completely normal, you will receive your results only by: MyChart Message (if you have MyChart) OR A paper copy in the mail If you have any lab test that is abnormal or we need to change your treatment, we will call you to review the results.   Testing/Procedures: NONE   Follow-Up: At [facility], you and your health needs are our priority.  As part of our continuing mission to provide you with exceptional heart care, we have created designated Provider Care Teams.  These Care Teams include your primary Cardiologist (physician) and Advanced Practice Providers (APPs -  Physician Assistants and Nurse Practitioners) who all work together to provide you with the care you need, when you need it.   Your next appointment:   1 year(s)  Provider:   Christell Constant, MD

## 2023-06-20 ENCOUNTER — Telehealth: Payer: Self-pay | Admitting: Internal Medicine

## 2023-06-20 MED ORDER — ISOSORBIDE MONONITRATE ER 30 MG PO TB24: 15.0000 mg | ORAL_TABLET | Freq: Every day | ORAL | 4 refills | Status: DC

## 2023-06-20 NOTE — Telephone Encounter (Signed)
Calling with questions about medication. Calling to see if the dr made any changes. Please advise

## 2023-06-20 NOTE — Telephone Encounter (Signed)
Called son in regards to medications.  Questions if pt should be taking a whole pill of imdur or 1/2 pill.  Advised pt should take 1/2 pill.  Script was sent in as 1 pill at last OV.  Updated script to include half a pill once daily.  Son had no further questions or concerns.

## 2023-06-28 ENCOUNTER — Other Ambulatory Visit: Payer: Self-pay | Admitting: Internal Medicine

## 2023-08-05 DIAGNOSIS — G4733 Obstructive sleep apnea (adult) (pediatric): Secondary | ICD-10-CM | POA: Diagnosis not present

## 2023-08-06 ENCOUNTER — Other Ambulatory Visit: Payer: Self-pay | Admitting: Primary Care

## 2023-08-06 DIAGNOSIS — E039 Hypothyroidism, unspecified: Secondary | ICD-10-CM

## 2023-08-07 NOTE — Telephone Encounter (Signed)
Patient has been scheduled

## 2023-08-07 NOTE — Telephone Encounter (Signed)
Patient is due for CPE/follow up in mid January 2025, this will be required prior to any further refills. Please call patient's son to schedule as she primarily speaks [place].  Thanks!

## 2023-08-29 ENCOUNTER — Encounter: Payer: Self-pay | Admitting: Internal Medicine

## 2023-08-29 ENCOUNTER — Telehealth: Payer: Self-pay | Admitting: Internal Medicine

## 2023-08-29 NOTE — Telephone Encounter (Signed)
Pt son called in stating pt "is in pain" but not sure what kind of pain. He also stated she has been having SOB. He stated he will speak with her to get further details on symptoms and c/b or send mychart message.

## 2023-08-29 NOTE — Telephone Encounter (Signed)
Spoke with pt's son, DPR who reports pt is complaining of  recent chest pressure on exertion, intermittent SOB and random palpitations.  He denies pt is having current symptoms.  She is taking medications as prescribed.  He does not have current BP or HR readings.  Pt's son feels an appointment with Dr Izora Ribas is indicated.  Advised will forward to Dr Debby Bud nurse for further assistance.  Reviewed ED precautions.  Pt's son verbalizes understanding and agrees with current plan.

## 2023-08-29 NOTE — Telephone Encounter (Signed)
Spoke with pt son reports pt has c/o chest discomfort with ambulation.  Offered 09/01/23 ov at 3:30 pm he reports he will be traveling but mom will take appointment pt will need an interpreter. Pt chart is marked needs an interpreter.

## 2023-09-01 ENCOUNTER — Ambulatory Visit: Payer: 59 | Attending: Internal Medicine | Admitting: Internal Medicine

## 2023-09-01 ENCOUNTER — Encounter: Payer: Self-pay | Admitting: Internal Medicine

## 2023-09-01 VITALS — BP 132/70 | HR 68 | Ht 65.0 in | Wt 164.0 lb

## 2023-09-01 DIAGNOSIS — I25118 Atherosclerotic heart disease of native coronary artery with other forms of angina pectoris: Secondary | ICD-10-CM | POA: Diagnosis not present

## 2023-09-01 DIAGNOSIS — Z8249 Family history of ischemic heart disease and other diseases of the circulatory system: Secondary | ICD-10-CM

## 2023-09-01 DIAGNOSIS — I10 Essential (primary) hypertension: Secondary | ICD-10-CM | POA: Diagnosis not present

## 2023-09-01 DIAGNOSIS — I4729 Other ventricular tachycardia: Secondary | ICD-10-CM | POA: Insufficient documentation

## 2023-09-01 DIAGNOSIS — I471 Supraventricular tachycardia, unspecified: Secondary | ICD-10-CM

## 2023-09-01 MED ORDER — METOPROLOL TARTRATE 25 MG PO TABS: 25.0000 mg | ORAL_TABLET | Freq: Two times a day (BID) | ORAL | 3 refills | Status: DC

## 2023-09-01 NOTE — Progress Notes (Signed)
Cardiology Office Note:    Date:  09/01/2023   ID:  Bethany Sharp, DOB [birth year], MRN [id number]  PCP:  Doreene Nest, NP   [facility]  Cardiologist:  Riley Lam MD Advanced Practice Provider:  No care team member to display Electrophysiologist:  None      CC: CAD f/u Interpretor Service used (her son is out of town on business to [place]; her friend in the room will update him)  History of Present Illness:    Bethany Sharp is a 77 y.o. female with a hx of HLD, history of tobacco abuse (distant) , and family history of aortic dissection who presents for evaluation  2022: asymptomatic SVT and positive CCTA; felt better and did not want cath.  Planned for medical therapy. 2023: Went to [place].  Down to 1 cigarette a day  Discussed the use of AI scribe software for clinical note transcription with the patient, who gave verbal consent to proceed.  Bethany Sharp, a 77 year old with a history of nonobstructive coronary artery disease, has been asymptomatic since the initial evaluation in 2022. Recently, she presented with exertional chest discomfort. She also reported experiencing strong palpitations and shortness of breath, particularly during periods of rest such as upon waking in the morning or at night. These symptoms have been occurring for the past month and have been described as alarming. She also noted increased shortness of breath during walking. She has a history of supraventricular tachycardia (SVT) diagnosed in 2022, which was not medicated at the time due to symptom resolution. She has a smoking history of two cigarettes per day.   Past Medical History:  Diagnosis Date   Allergies    Anal fissure    Anxiety    Arthritis    CAD (coronary artery disease)    Clotting disorder (HCC)    Colon polyps    Diverticulosis    GERD (gastroesophageal reflux disease)    HTN (hypertension)    Hyperlipidemia    Internal hemorrhoids    Sleep apnea     wears c-pap   Sleep apnea with use of continuous positive airway pressure (CPAP)    Thyroid disease    hypothyroidism   Tubular adenoma of colon     Past Surgical History:  Procedure Laterality Date   APPENDECTOMY  1960   CESAREAN SECTION     COLONOSCOPY     tummy tuck      Current Medications: Current Meds  Medication Sig   aspirin EC (ASPIRIN LOW DOSE) 81 MG tablet TAKE 1 TABLET (81 MG TOTAL) BY MOUTH DAILY. SWALLOW WHOLE.   atorvastatin (LIPITOR) 20 MG tablet Take 1 tablet (20 mg total) by mouth daily.   Carbinoxamine Maleate 4 MG TABS Take 1 tablet (4 mg total) by mouth 2 (two) times daily. For allergies.   fluticasone (FLONASE) 50 MCG/ACT nasal spray Place 1 spray into both nostrils 2 (two) times daily.   irbesartan (AVAPRO) 75 MG tablet Take 1 tablet (75 mg total) by mouth daily. For blood pressure.   isosorbide mononitrate (IMDUR) 30 MG 24 hr tablet Take 0.5 tablets (15 mg total) by mouth daily.   levocetirizine (XYZAL) 5 MG tablet TAKE 1 TABLET BY MOUTH EVERY DAY IN THE EVENING FOR ALLERGIES   levothyroxine (LEVOXYL) 50 MCG tablet Take 1 tablet by mouth every morning on an empty stomach with water only.  No food or other medications for 30 minutes.   methocarbamol (ROBAXIN) 500 MG tablet Take 1 tablet (500  mg total) by mouth 2 (two) times daily.   metoprolol tartrate (LOPRESSOR) 25 MG tablet Take 1 tablet (25 mg total) by mouth 2 (two) times daily.   omeprazole (PRILOSEC) 40 MG capsule TAKE 1 CAPSULE (40 MG TOTAL) BY MOUTH 2 (TWO) TIMES DAILY BEFORE A MEAL.   sertraline (ZOLOFT) 50 MG tablet TAKE 1 TABLET (50 MG TOTAL) BY MOUTH DAILY. FOR ANXIETY.     Allergies:   Patient has no known allergies.   Social History   Socioeconomic History   Marital status: Divorced    Spouse name: Not on file   Number of children: 1   Years of education: Not on file   Highest education level: Not on file  Occupational History   Not on file  Tobacco Use   Smoking status: Every Day     Current packs/day: 0.25    Average packs/day: 0.3 packs/day for 62.9 years (15.7 ttl pk-yrs)    Types: Cigarettes    Start date: 1962   Smokeless tobacco: Never   Tobacco comments:    smokes about 4 cigs a day  Vaping Use   Vaping status: Never Used  Substance and Sexual Activity   Alcohol use: Yes    Comment: wine 2 glasses per day   Drug use: Never   Sexual activity: Not on file  Other Topics Concern   Not on file  Social History Narrative   Not on file   Social Determinants of Health   Financial Resource Strain: Not on file  Food Insecurity: Not on file  Transportation Needs: Not on file  Physical Activity: Not on file  Stress: Not on file  Social Connections: Not on file    Social: patient presents with son, [name], Goes  back to [place] for one month  Family History: Grandfather died at age 22 from PE Grandmother died at age 67 from an aortic dissection. Mother had a dissection. Sister died from colon cancer. Mother had   ROS:   Please see the history of present illness.     EKGs/Labs/Other Studies Reviewed:    The following studies were reviewed today:  EKG:   12/07/20: SR 64  Cardiac Studies & Procedures         MONITORS  Sharp TERM MONITOR (3-14 DAYS) 01/16/2021  Narrative  Patient had a minimum heart rate of 46 bpm, maximum heart rate of 207 bpm, and average heart rate of 68 bpm.  Predominant underlying rhythm was sinus rhythm.  One run of non-sustained ventricular tachycardia occurred lasting 8 beats at longest with a max rate of 207 bpm at fastest.  Multiple (~ 380) runs of supraventricular tachycardia occurred lasting 35 seconds at longest with a max rate of 95 bpm at fastest.  Isolated PACs were occasional (1.3%), with rare couplets and triplets present.  Isolated PVCs were rare (<1.0%), with rare couplets.  Triggered and diary events associated with NSVT and PVCs.  Has many short runs of asymptomatic SVT with symptomatic PVCs and  NSVT.   CT SCANS  CT CORONARY MORPH W/CTA COR W/SCORE 01/08/2021  Addendum 01/08/2021  8:03 PM ADDENDUM REPORT: 01/08/2021 20:01  CLINICAL DATA:  77 year old female with dyspnea  EXAM: Cardiac/Coronary  CTA  TECHNIQUE: The patient was scanned on a [employer].  FINDINGS: A 120 kV prospective scan was triggered in the descending thoracic aorta at 111 HU's. Axial non-contrast 3 mm slices were carried out through the heart. The data set was analyzed on a dedicated work  station and scored using the Agatson method. Gantry rotation speed was 250 msecs and collimation was .6 mm. Beta blockade and 0.8 mg of sl NTG was given. The 3D data set was reconstructed in 5% intervals of the 67-82 % of the R-R cycle. Diastolic phases were analyzed on a dedicated work station using MPR, MIP and VRT modes. The patient received 80 cc of contrast.  Aorta:  Normal size.  No calcifications.  No dissection.  Aortic Valve:  Trileaflet.  No calcifications.  Coronary Arteries:  Normal coronary origin.  Right dominance.  RCA is a large dominant artery that gives rise to PDA and PLA. There is no plaque.  Left main is a large artery that gives rise to LAD and LCX arteries.  LAD is a large vessel that has mid calcified plaque at bifurcation of second diagonal branch. There is step artifact that makes interpretation of the vessel challenging. The ostium of the diagonal appears severely stenotic.  LCX is a non-dominant artery that gives rise to one large OM1 branch. There is no plaque.  Other findings:  Normal pulmonary vein drainage into the left atrium.  Normal left atrial appendage without a thrombus.  Normal size of the pulmonary artery.  Please see radiology report for non cardiac findings.  IMPRESSION: 1. Coronary calcium score of 74. This was 84 percentile for age and sex matched control.  2. Normal coronary origin with right dominance.  3. Mid LAD calcified plaque at  bifurcation with diagonal. Possible severe ostial disease of diagonal branch (stair step artifact markedly reduces quality of study). Will attempt FFR analysis. If symptoms worsen or become more worrisome, recommend further cardiac testing.   Electronically Signed By: Donato Schultz MD On: 01/08/2021 20:01  Narrative EXAM: OVER-READ INTERPRETATION  CT CHEST  The following report is an over-read performed by radiologist Dr. Irish Lack of [facility] on 01/08/2021. This over-read does not include interpretation of cardiac or coronary anatomy or pathology. The coronary CTA interpretation by the cardiologist is attached.  COMPARISON:  None.  FINDINGS: Vascular: No significant vascular findings. Normal heart size. No pericardial effusion.  Mediastinum/Nodes: Visualized mediastinum and hilar regions demonstrate no lymphadenopathy or masses.  Lungs/Pleura: Less images demonstrate calcification along the right posteromedial lung base near the diaphragm consistent with a calcified granuloma. Visualized lungs show no evidence of pulmonary edema, consolidation, pneumothorax or pleural fluid.  Upper Abdomen: No acute abnormality.  Musculoskeletal: No chest wall mass or suspicious bone lesions identified.  IMPRESSION: No significant incidental findings. Calcified granuloma along the right posteromedial lung base near the diaphragm.  Electronically Signed: By: Irish Lack M.D. On: 01/08/2021 17:00           Recent Labs: 10/18/2022: ALT 15; BUN 11; Creat 0.75; Potassium 4.2; Sodium 138; TSH 1.15  Recent Lipid Panel    Component Value Date/Time   CHOL 176 10/18/2022 1513   CHOL 189 05/07/2021 0941   TRIG 207 (H) 10/18/2022 1513   HDL 93 10/18/2022 1513   HDL 93 05/07/2021 0941   CHOLHDL 1.9 10/18/2022 1513   VLDL 24.8 09/19/2021 1103   LDLCALC 56 10/18/2022 1513   LDLDIRECT 132.0 12/07/2020 1620     Physical Exam:    VS:  BP 132/70   Pulse 68    Ht 5\' 5"  (1.651 m)   Wt 164 lb (74.4 kg)   SpO2 98%   BMI 27.29 kg/m     Wt Readings from Last 3 Encounters:  09/01/23 164 lb (74.4  kg)  06/13/23 160 lb 12.8 oz (72.9 kg)  10/18/22 160 lb (72.6 kg)   Gen: No distress   Neck: No JVD Cardiac: No Rubs or Gallops, soft systolic murmur, RRR +2 radial pulses Respiratory: Clear to auscultation bilaterally, normal effort, normal  respiratory rate GI: Soft, nontender, non-distended  MS: No edema;  moves all extremities Integument: Skin feels warm Neuro:  At time of evaluation, alert and oriented to person/place/time/situation  Psych: Normal affect, patient feels well   ASSESSMENT:    1. Coronary artery disease of native artery of native heart with stable angina pectoris (HCC)   2. SVT (supraventricular tachycardia) (HCC)   3. NSVT (nonsustained ventricular tachycardia) (HCC)   4. Family history of aortic dissection   5. Essential hypertension      PLAN:    Nonobstructive Coronary Artery Disease with Worsening Angina - Diagnosed in 2022, now presenting with exertional chest discomfort, palpitations, and nocturnal dyspnea. Discussed stress testing versus heart catheterization. Plan to attempt medical management with metoprolol. Risks include bradycardia and hypotension; benefits include symptom relief and reduced cardiac event risk. Alternatives include other beta-blockers or calcium channel blockers. Further invasive testing if symptoms persist. - hx of aortic aneurysm and dissection (mother) - do not suspect this for her - she has a pulmonary nodule that PCP follows; CT will also see it; she still smokes and we have discussed cessation again - Start metoprolol 12.5 mg PO BID - Schedule PET MPI - If high-risk findings are present, bring back for further discussion  Supraventricular Tachycardia (SVT) and NSVT - Identified in 2022, currently experiencing palpitations and shortness of breath, particularly in the morning and at night.  Metoprolol can help control heart rate and reduce palpitations.  - Start metoprolol 12.5 mg PO BID, if no improvement start heart monitor  General Health Maintenance Smokes 1-2 cigarettes per day. Encouraged to reduce cigarette smoking to lower cardiovascular risk. - a friend is in the room with her and will let her son know what we discussed  Post stress test f/u  Medication Adjustments/Labs and Tests Ordered: Current medicines are reviewed at length with the patient today.  Concerns regarding medicines are outlined above.  Orders Placed This Encounter  Procedures   NM PET CT CARDIAC PERFUSION MULTI W/ABSOLUTE BLOODFLOW    Meds ordered this encounter  Medications   metoprolol tartrate (LOPRESSOR) 25 MG tablet    Sig: Take 1 tablet (25 mg total) by mouth 2 (two) times daily.    Dispense:  180 tablet    Refill:  3     Patient Instructions  Medication Instructions:  Your physician has recommended you make the following change in your medication:  START: metoprolol tartrate (Lopressor) 12.5 mg by mouth twice daily  *If you need a refill on your cardiac medications before your next appointment, please call your pharmacy*   Lab Work: NONE If you have labs (blood work) drawn today and your tests are completely normal, you will receive your results only by: MyChart Message (if you have MyChart) OR A paper copy in the mail If you have any lab test that is abnormal or we need to change your treatment, we will call you to review the results.   Testing/Procedures: Your physician has requested that you have a Cardiac PET Scan.    Follow-Up: At [facility], you and your health needs are our priority.  As part of our continuing mission to provide you with exceptional heart care, we have created designated Provider  Care Teams.  These Care Teams include your primary Cardiologist (physician) and Advanced Practice Providers (APPs -  Physician Assistants and Nurse Practitioners)  who all work together to provide you with the care you need, when you need it.  We recommend signing up for the patient portal called "MyChart".  Sign up information is provided on this After Visit Summary.  MyChart is used to connect with patients for Virtual Visits (Telemedicine).  Patients are able to view lab/test results, encounter notes, upcoming appointments, etc.  Non-urgent messages can be sent to your provider as well.   To learn more about what you can do with MyChart, go to [email or web address].    Your next appointment:   3 month(s)  Provider:   Christell Constant, MD     Other Instructions How to Prepare for Your Cardiac PET/CT Stress Test:  1. Please do not take these medications before your test:   Medications that may interfere with the cardiac pharmacological stress agent (ex. nitrates - including erectile dysfunction medications, isosorbide mononitrate- [please start to hold this medication the day before the test], tamulosin or beta-blockers- - - Metoprolol Tartrate-Lopressor) the day of the exam. (Erectile dysfunction medication should be held for at least 72 hrs prior to test) Theophylline containing medications for 12 hours. Dipyridamole 48 hours prior to the test. Your remaining medications may be taken with water.  2. Nothing to eat or drink, except water, 3 hours prior to arrival time.   NO caffeine/decaffeinated products, or chocolate 12 hours prior to arrival.  3. NO perfume, cologne or lotion on chest or abdomen area.          - FEMALES - Please avoid wearing dresses to this appointment.  4. Total time is 1 to 2 hours; you may want to bring reading material for the waiting time.  5. Please report to Radiology at the [facility] Main Entrance 30 minutes early for your test.  [address] [place], [place] [address]  6. Please report to Radiology at [facility] Main Entrance, medical mall, 30 mins prior to your  test.  [address]  [place], [place]  [phone]  Diabetic Preparation:  Hold oral medications. You may take NPH and Lantus insulin. Do not take Humalog or Humulin R (Regular Insulin) the day of your test. Check blood sugars prior to leaving the house. If able to eat breakfast prior to 3 hour fasting, you may take all medications, including your insulin, Do not worry if you Bethany your breakfast dose of insulin - start at your next meal. Patients who wear a continuous glucose monitor MUST remove the device prior to scanning.  IF YOU THINK YOU MAY BE PREGNANT, OR ARE NURSING PLEASE INFORM THE TECHNOLOGIST.  In preparation for your appointment, medication and supplies will be purchased.  Appointment availability is limited, so if you need to cancel or reschedule, please call the Radiology Department at [phone] [facility]) OR [phone] [facility])  24 hours in advance to avoid a cancellation fee of $100.00  What to Expect After you Arrive:  Once you arrive and check in for your appointment, you will be taken to a preparation room within the Radiology Department.  A technologist or Nurse will obtain your medical history, verify that you are correctly prepped for the exam, and explain the procedure.  Afterwards,  an IV will be started in your arm and electrodes will be placed on your skin for EKG monitoring during the stress  portion of the exam. Then you will be escorted to the PET/CT scanner.  There, staff will get you positioned on the scanner and obtain a blood pressure and EKG.  During the exam, you will continue to be connected to the EKG and blood pressure machines.  A small, safe amount of a radioactive tracer will be injected in your IV to obtain a series of pictures of your heart along with an injection of a stress agent.    After your Exam:  It is recommended that you eat a meal and drink a caffeinated beverage to counter act any effects of the stress agent.  Drink plenty  of fluids for the remainder of the day and urinate frequently for the first couple of hours after the exam.  Your doctor will inform you of your test results within 7-10 business days.  For more information and frequently asked questions, please visit our website : [email or web address]  For questions about your test or how to prepare for your test, please call: Cardiac Imaging Nurse Navigators Office: [phone]     Signed, Christell Constant, MD  09/01/2023 4:56 PM    [facility]

## 2023-09-01 NOTE — Patient Instructions (Signed)
Medication Instructions:  Your physician has recommended you make the following change in your medication:  START: metoprolol tartrate (Lopressor) 12.5 mg by mouth twice daily  *If you need a refill on your cardiac medications before your next appointment, please call your pharmacy*   Lab Work: NONE If you have labs (blood work) drawn today and your tests are completely normal, you will receive your results only by: MyChart Message (if you have MyChart) OR A paper copy in the mail If you have any lab test that is abnormal or we need to change your treatment, we will call you to review the results.   Testing/Procedures: Your physician has requested that you have a Cardiac PET Scan.    Follow-Up: At [facility], you and your health needs are our priority.  As part of our continuing mission to provide you with exceptional heart care, we have created designated Provider Care Teams.  These Care Teams include your primary Cardiologist (physician) and Advanced Practice Providers (APPs -  Physician Assistants and Nurse Practitioners) who all work together to provide you with the care you need, when you need it.  We recommend signing up for the patient portal called "MyChart".  Sign up information is provided on this After Visit Summary.  MyChart is used to connect with patients for Virtual Visits (Telemedicine).  Patients are able to view lab/test results, encounter notes, upcoming appointments, etc.  Non-urgent messages can be sent to your provider as well.   To learn more about what you can do with MyChart, go to [email or web address].    Your next appointment:   3 month(s)  Provider:   Christell Constant, MD     Other Instructions How to Prepare for Your Cardiac PET/CT Stress Test:  1. Please do not take these medications before your test:   Medications that may interfere with the cardiac pharmacological stress agent (ex. nitrates - including erectile dysfunction  medications, isosorbide mononitrate- [please start to hold this medication the day before the test], tamulosin or beta-blockers- - - Metoprolol Tartrate-Lopressor) the day of the exam. (Erectile dysfunction medication should be held for at least 72 hrs prior to test) Theophylline containing medications for 12 hours. Dipyridamole 48 hours prior to the test. Your remaining medications may be taken with water.  2. Nothing to eat or drink, except water, 3 hours prior to arrival time.   NO caffeine/decaffeinated products, or chocolate 12 hours prior to arrival.  3. NO perfume, cologne or lotion on chest or abdomen area.          - FEMALES - Please avoid wearing dresses to this appointment.  4. Total time is 1 to 2 hours; you may want to bring reading material for the waiting time.  5. Please report to Radiology at the [facility] Main Entrance 30 minutes early for your test.  [address] [place], [place] [address]  6. Please report to Radiology at [facility] Main Entrance, medical mall, 30 mins prior to your test.  [address]  [place], [place]  [phone]  Diabetic Preparation:  Hold oral medications. You may take NPH and Lantus insulin. Do not take Humalog or Humulin R (Regular Insulin) the day of your test. Check blood sugars prior to leaving the house. If able to eat breakfast prior to 3 hour fasting, you may take all medications, including your insulin, Do not worry if you miss your breakfast dose of insulin - start at your next meal.  Patients who wear a continuous glucose monitor MUST remove the device prior to scanning.  IF YOU THINK YOU MAY BE PREGNANT, OR ARE NURSING PLEASE INFORM THE TECHNOLOGIST.  In preparation for your appointment, medication and supplies will be purchased.  Appointment availability is limited, so if you need to cancel or reschedule, please call the Radiology Department at [phone] [facility]) OR  [phone] [facility])  24 hours in advance to avoid a cancellation fee of $100.00  What to Expect After you Arrive:  Once you arrive and check in for your appointment, you will be taken to a preparation room within the Radiology Department.  A technologist or Nurse will obtain your medical history, verify that you are correctly prepped for the exam, and explain the procedure.  Afterwards,  an IV will be started in your arm and electrodes will be placed on your skin for EKG monitoring during the stress portion of the exam. Then you will be escorted to the PET/CT scanner.  There, staff will get you positioned on the scanner and obtain a blood pressure and EKG.  During the exam, you will continue to be connected to the EKG and blood pressure machines.  A small, safe amount of a radioactive tracer will be injected in your IV to obtain a series of pictures of your heart along with an injection of a stress agent.    After your Exam:  It is recommended that you eat a meal and drink a caffeinated beverage to counter act any effects of the stress agent.  Drink plenty of fluids for the remainder of the day and urinate frequently for the first couple of hours after the exam.  Your doctor will inform you of your test results within 7-10 business days.  For more information and frequently asked questions, please visit our website : [email or web address]  For questions about your test or how to prepare for your test, please call: Cardiac Imaging Nurse Navigators Office: [phone]

## 2023-09-09 ENCOUNTER — Ambulatory Visit: Payer: 59 | Admitting: Nurse Practitioner

## 2023-09-09 ENCOUNTER — Encounter: Payer: Self-pay | Admitting: Nurse Practitioner

## 2023-09-09 ENCOUNTER — Ambulatory Visit (INDEPENDENT_AMBULATORY_CARE_PROVIDER_SITE_OTHER)
Admission: RE | Admit: 2023-09-09 | Discharge: 2023-09-09 | Disposition: A | Discharge status: Home / Self Care | Payer: 59 | Source: Ambulatory Visit | Attending: Nurse Practitioner | Admitting: Nurse Practitioner

## 2023-09-09 VITALS — BP 128/80 | HR 56 | Temp 98.6°F | Ht 65.0 in | Wt 166.8 lb

## 2023-09-09 DIAGNOSIS — M79672 Pain in left foot: Secondary | ICD-10-CM | POA: Diagnosis not present

## 2023-09-09 DIAGNOSIS — M25572 Pain in left ankle and joints of left foot: Secondary | ICD-10-CM

## 2023-09-09 DIAGNOSIS — M7989 Other specified soft tissue disorders: Secondary | ICD-10-CM | POA: Diagnosis not present

## 2023-09-09 DIAGNOSIS — W19XXXA Unspecified fall, initial encounter: Secondary | ICD-10-CM | POA: Diagnosis not present

## 2023-09-09 DIAGNOSIS — M25562 Pain in left knee: Secondary | ICD-10-CM | POA: Diagnosis not present

## 2023-09-09 DIAGNOSIS — M19072 Primary osteoarthritis, left ankle and foot: Secondary | ICD-10-CM | POA: Diagnosis not present

## 2023-09-09 DIAGNOSIS — M1712 Unilateral primary osteoarthritis, left knee: Secondary | ICD-10-CM | POA: Diagnosis not present

## 2023-09-09 DIAGNOSIS — M25462 Effusion, left knee: Secondary | ICD-10-CM | POA: Diagnosis not present

## 2023-09-09 HISTORY — DX: Pain in left knee: M25.562

## 2023-09-09 HISTORY — DX: Pain in left ankle and joints of left foot: M25.572

## 2023-09-09 HISTORY — DX: Pain in left foot: M79.672

## 2023-09-09 MED ORDER — IBUPROFEN 600 MG PO TABS
600.0000 mg | ORAL_TABLET | Freq: Three times a day (TID) | ORAL | 0 refills | Status: AC | PRN
Start: 2023-09-09 — End: ?

## 2023-09-09 NOTE — Assessment & Plan Note (Signed)
RICE therapy, pending films.  Patient can use low rise cam boot for support when she is up on it.  Ibuprofen as directed

## 2023-09-09 NOTE — Assessment & Plan Note (Signed)
Pending x-ray RICE therapy ibuprofen as prescribed

## 2023-09-09 NOTE — Progress Notes (Signed)
Acute Office Visit  Subjective:     Patient ID: Bethany Sharp, female    DOB: [birth year], 77 y.o.   MRN: [id number]  Chief Complaint  Patient presents with   Fall    Pt complains of left leg and left knee pain due to a fall yesterday. States she twisted her leg. Has taken OTC. Pt has had trouble sleeping (max 2 hours) . States that left ankle hurts the most and swollen. Knee is also swollen and Cannot bend the knee.      Patient is in today for fall with a history of clotting disorder, htn, hld, thyroid disease  States that she fell at 3pm yesterday slipping [place] in the [place]. States that she fell with her leg underneath her. States that the whole leg hurts (left). States that she fell a tear sensation and then a pain. States that she tried a boot that was form home. States that it hurts with just sitting still (ankle). The knee does not hurt at rest. States that she was taking tylenol and ibuprofen. States that she alternated it. Has not taken anything yet.   Patient did not hit her head or have any lightheadedness or dizziness.  Patient is accompanied by her son who helps translate  Review of Systems  Constitutional:  Negative for chills and fever.  Musculoskeletal:  Positive for joint pain.  Neurological:  Negative for dizziness, tingling and weakness.        Objective:    BP 128/80   Pulse (!) 56   Temp 98.6 F (37 C) (Oral)   Ht 5\' 5"  (1.651 m)   Wt 166 lb 12.8 oz (75.7 kg)   SpO2 97%   BMI 27.76 kg/m    Physical Exam Cardiovascular:     Pulses:          Dorsalis pedis pulses are 2+ on the left side.  Musculoskeletal:        General: Tenderness and signs of injury present.       Legs:  Skin:    Capillary Refill: Capillary refill takes less than 2 seconds.     No results found for any visits on 09/09/23.      Assessment & Plan:   Problem List Items Addressed This Visit       Other   Fall - Primary    Mechanical in nature pending x-rays       Relevant Orders   DG Knee Complete 4 Views Left   DG Ankle Complete Left   DG Foot Complete Left   Acute pain of left knee    Pending x-ray RICE therapy ibuprofen as prescribed      Relevant Orders   DG Knee Complete 4 Views Left   Left foot pain    RICE therapy pending films ibuprofen as prescribed      Relevant Orders   DG Foot Complete Left   Acute left ankle pain    RICE therapy, pending films.  Patient can use low rise cam boot for support when she is up on it.  Ibuprofen as directed      Relevant Orders   DG Ankle Complete Left    Meds ordered this encounter  Medications   ibuprofen (ADVIL) 600 MG tablet    Sig: Take 1 tablet (600 mg total) by mouth every 8 (eight) hours as needed.    Dispense:  30 tablet    Refill:  0    Order Specific Question:  Supervising Provider    Answer:   Roxy Manns A [[id number]]    No follow-ups on file.  Audria Nine, NP

## 2023-09-09 NOTE — Patient Instructions (Signed)
Nice to see you today I am going to send in some prescription Ibuprofen to take 3 times a day fo r the next 5-7 days Follow up if you do not improve.   If you need additional pain relief tylenol over the counter is ok to take along with the prescripton

## 2023-09-09 NOTE — Assessment & Plan Note (Signed)
Mechanical in nature pending x-rays

## 2023-09-09 NOTE — Assessment & Plan Note (Addendum)
RICE therapy pending films ibuprofen as prescribed

## 2023-09-15 ENCOUNTER — Telehealth: Payer: Self-pay | Admitting: Nurse Practitioner

## 2023-09-15 DIAGNOSIS — M25562 Pain in left knee: Secondary | ICD-10-CM

## 2023-09-15 DIAGNOSIS — W19XXXA Unspecified fall, initial encounter: Secondary | ICD-10-CM

## 2023-09-15 DIAGNOSIS — M25572 Pain in left ankle and joints of left foot: Secondary | ICD-10-CM

## 2023-09-15 NOTE — Telephone Encounter (Signed)
Referral placed.

## 2023-09-15 NOTE — Telephone Encounter (Signed)
-----   Message from [facility] sent at 09/15/2023 11:34 AM EST ----- Called patient son and reviewed all information. He verbalized understanding.  Prefers bone specialist in [place].  Will call if any further questions.

## 2023-09-19 DIAGNOSIS — S82392A Other fracture of lower end of left tibia, initial encounter for closed fracture: Secondary | ICD-10-CM | POA: Diagnosis not present

## 2023-09-19 DIAGNOSIS — F17200 Nicotine dependence, unspecified, uncomplicated: Secondary | ICD-10-CM | POA: Diagnosis not present

## 2023-10-03 ENCOUNTER — Telehealth: Payer: Self-pay | Admitting: Internal Medicine

## 2023-10-03 NOTE — Telephone Encounter (Signed)
Calling to speak to the nurse about why the testing/procedure was denied. Please advise

## 2023-10-03 NOTE — Telephone Encounter (Signed)
Called pt son  expresses he is concerned that testing has not been approved.  He spoke with someone with insurance company and was told pt had to meet certain criteria in order for testing to be approved.  Also reports pt needs a recent ECG.  Advised him ECG was done on 06/13/23 is available on my chart for review.  Advised son to f/u with our Billing department for status of appeal.  Son expresses he will call to f/u. I have also sent a message requesting an update on appeal.    I advised son of MD note to billing:  "Chandrasekhar, Rondel Jumbo, MD  Abdul-Razzaaq, Monia Pouch, Arvid Right, RN The appeal notes: Imaging can be done when needed for one of the reasons listed below if you have new, recurring or worsening symptoms of chest pain, shortness of breath when you exert yourself, or feeling overtired after being active.  My note from our visit comments that she has shortness of breath with walking.  That would be a form of exerting herself and should meet their requirement.  Riley Lam, MD [facility]"  Advised son to f/u with our Billing department for status of appeal.  Son expresses he will call to f/u.

## 2023-10-19 ENCOUNTER — Other Ambulatory Visit: Payer: Self-pay | Admitting: Internal Medicine

## 2023-10-20 ENCOUNTER — Other Ambulatory Visit: Payer: Self-pay | Admitting: Internal Medicine

## 2023-10-20 ENCOUNTER — Encounter: Payer: Self-pay | Admitting: Internal Medicine

## 2023-10-20 MED ORDER — IRBESARTAN 75 MG PO TABS: 75.0000 mg | ORAL_TABLET | Freq: Every day | ORAL | 1 refills | Status: DC

## 2023-10-22 ENCOUNTER — Telehealth: Payer: Self-pay | Admitting: Pharmacy Technician

## 2023-10-22 ENCOUNTER — Other Ambulatory Visit (HOSPITAL_COMMUNITY): Payer: Self-pay

## 2023-10-22 DIAGNOSIS — S82392A Other fracture of lower end of left tibia, initial encounter for closed fracture: Secondary | ICD-10-CM | POA: Diagnosis not present

## 2023-10-22 DIAGNOSIS — M19071 Primary osteoarthritis, right ankle and foot: Secondary | ICD-10-CM | POA: Diagnosis not present

## 2023-10-22 NOTE — Telephone Encounter (Signed)
PA has been submitted, and telephone encounter has been created. 

## 2023-10-22 NOTE — Telephone Encounter (Signed)
 Pharmacy Patient Advocate Encounter   Received notification from RX Request Messages that prior authorization for OMEPRAZOLE  40MG  is required/requested.   Insurance verification completed.   The patient is insured through [facility] .   Per test claim: PA required; PA submitted to above mentioned insurance via CoverMyMeds Key/confirmation #/EOC [id number] Status is pending

## 2023-10-23 ENCOUNTER — Other Ambulatory Visit (HOSPITAL_COMMUNITY): Payer: Self-pay

## 2023-10-24 ENCOUNTER — Other Ambulatory Visit (HOSPITAL_COMMUNITY): Payer: Self-pay

## 2023-10-24 NOTE — Telephone Encounter (Signed)
 Pharmacy Patient Advocate Encounter  Received notification from [facility] that Prior Authorization for OMEPRAZOLE  40MG  has been APPROVED from 1.8.24 to 1.7.26. Ran test claim, Copay is $0. This test claim was processed through [facility]- copay amounts may vary at other pharmacies due to pharmacy/plan contracts, or as the patient moves through the different stages of their insurance plan.   PA #/Case ID/Reference #: [id number]

## 2023-10-29 ENCOUNTER — Encounter: Payer: Self-pay | Admitting: Primary Care

## 2023-10-29 ENCOUNTER — Ambulatory Visit (INDEPENDENT_AMBULATORY_CARE_PROVIDER_SITE_OTHER): Payer: 59 | Admitting: Primary Care

## 2023-10-29 VITALS — BP 128/84 | HR 65 | Temp 97.0°F | Ht 65.0 in | Wt 163.0 lb

## 2023-10-29 DIAGNOSIS — K2100 Gastro-esophageal reflux disease with esophagitis, without bleeding: Secondary | ICD-10-CM

## 2023-10-29 DIAGNOSIS — I10 Essential (primary) hypertension: Secondary | ICD-10-CM

## 2023-10-29 DIAGNOSIS — J3089 Other allergic rhinitis: Secondary | ICD-10-CM

## 2023-10-29 DIAGNOSIS — I25118 Atherosclerotic heart disease of native coronary artery with other forms of angina pectoris: Secondary | ICD-10-CM | POA: Diagnosis not present

## 2023-10-29 DIAGNOSIS — D229 Melanocytic nevi, unspecified: Secondary | ICD-10-CM

## 2023-10-29 DIAGNOSIS — Z0001 Encounter for general adult medical examination with abnormal findings: Secondary | ICD-10-CM

## 2023-10-29 DIAGNOSIS — Z0000 Encounter for general adult medical examination without abnormal findings: Secondary | ICD-10-CM | POA: Diagnosis not present

## 2023-10-29 DIAGNOSIS — E785 Hyperlipidemia, unspecified: Secondary | ICD-10-CM | POA: Diagnosis not present

## 2023-10-29 DIAGNOSIS — R911 Solitary pulmonary nodule: Secondary | ICD-10-CM | POA: Diagnosis not present

## 2023-10-29 DIAGNOSIS — G4733 Obstructive sleep apnea (adult) (pediatric): Secondary | ICD-10-CM

## 2023-10-29 DIAGNOSIS — R0609 Other forms of dyspnea: Secondary | ICD-10-CM | POA: Insufficient documentation

## 2023-10-29 DIAGNOSIS — Z720 Tobacco use: Secondary | ICD-10-CM

## 2023-10-29 DIAGNOSIS — Z1231 Encounter for screening mammogram for malignant neoplasm of breast: Secondary | ICD-10-CM

## 2023-10-29 DIAGNOSIS — E039 Hypothyroidism, unspecified: Secondary | ICD-10-CM

## 2023-10-29 DIAGNOSIS — F411 Generalized anxiety disorder: Secondary | ICD-10-CM | POA: Diagnosis not present

## 2023-10-29 LAB — LIPID PANEL
Cholesterol: 195 mg/dL (ref 0–200)
HDL: 72.6 mg/dL (ref >39.00)
LDL Cholesterol: 74 mg/dL (ref 0–99)
NonHDL: 122.23
Total CHOL/HDL Ratio: 3
Triglycerides: 240 mg/dL — ABNORMAL HIGH (ref 0.0–149.0)
VLDL: 48 mg/dL — ABNORMAL HIGH (ref 0.0–40.0)

## 2023-10-29 LAB — COMPREHENSIVE METABOLIC PANEL
ALT: 22 U/L (ref 0–35)
AST: 23 U/L (ref 0–37)
Albumin: 4.6 g/dL (ref 3.5–5.2)
Alkaline Phosphatase: 82 U/L (ref 39–117)
BUN: 11 mg/dL (ref 6–23)
CO2: 28 meq/L (ref 19–32)
Calcium: 9.4 mg/dL (ref 8.4–10.5)
Chloride: 99 meq/L (ref 96–112)
Creatinine, Ser: 0.73 mg/dL (ref 0.40–1.20)
GFR: 78.58 mL/min (ref >60.00)
Glucose, Bld: 91 mg/dL (ref 70–99)
Potassium: 5 meq/L (ref 3.5–5.1)
Sodium: 135 meq/L (ref 135–145)
Total Bilirubin: 0.7 mg/dL (ref 0.2–1.2)
Total Protein: 6.4 g/dL (ref 6.0–8.3)

## 2023-10-29 LAB — CBC
HCT: 41.4 % (ref 36.0–46.0)
Hemoglobin: 13.5 g/dL (ref 12.0–15.0)
MCHC: 32.6 g/dL (ref 30.0–36.0)
MCV: 93.9 fL (ref 78.0–100.0)
Platelets: 267 10*3/uL (ref 150.0–400.0)
RBC: 4.41 Mil/uL (ref 3.87–5.11)
RDW: 13.4 % (ref 11.5–15.5)
WBC: 6.7 10*3/uL (ref 4.0–10.5)

## 2023-10-29 LAB — TSH: TSH: 1.54 u[IU]/mL (ref 0.35–5.50)

## 2023-10-29 LAB — HEMOGLOBIN A1C: Hgb A1c MFr Bld: 5.7 % (ref 4.6–6.5)

## 2023-10-29 MED ORDER — CARBINOXAMINE MALEATE 4 MG PO TABS: 1.0000 | ORAL_TABLET | Freq: Two times a day (BID) | ORAL | 3 refills | Status: AC

## 2023-10-29 MED ORDER — LEVOTHYROXINE SODIUM 50 MCG PO TABS: ORAL_TABLET | ORAL | 3 refills | Status: DC

## 2023-10-29 MED ORDER — TRAZODONE HCL 50 MG PO TABS: 50.0000 mg | ORAL_TABLET | Freq: Every evening | ORAL | 0 refills | Status: DC | PRN

## 2023-10-29 MED ORDER — FLUTICASONE-SALMETEROL 250-50 MCG/ACT IN AEPB: 1.0000 | INHALATION_SPRAY | Freq: Two times a day (BID) | RESPIRATORY_TRACT | 0 refills | Status: DC

## 2023-10-29 NOTE — Assessment & Plan Note (Signed)
 Overall controlled on a day-to-day basis.  Continue sertraline  50 mg daily. Add trazodone  50 mg at bedtime as needed.

## 2023-10-29 NOTE — Assessment & Plan Note (Signed)
 Repeat TSH pending.  Continue levothyroxine  50 mcg daily.

## 2023-10-29 NOTE — Assessment & Plan Note (Addendum)
 Asymptomatic. Following with cardiology, office notes reviewed from August 2024.  Continue aspirin  81 mg daily, atorvastatin  20 mg, isosorbide  mononitrate 15 mg BID.

## 2023-10-29 NOTE — Assessment & Plan Note (Signed)
 Reviewed CT chest from February 2024. Repeat CT chest ordered and pending.  If nodule is stable, then repeat CT in 18 to 24 months.

## 2023-10-29 NOTE — Assessment & Plan Note (Signed)
Stable. Continue omeprazole 40 mg twice daily  

## 2023-10-29 NOTE — Assessment & Plan Note (Signed)
 Declines immunizations  Mammogram and bone density scan due, orders placed. Colonoscopy UTD, due 2026  Discussed the importance of a healthy diet and regular exercise in order for weight loss, and to reduce the risk of further co-morbidity.  Exam stable. Labs pending.  Follow up in 1 year for repeat physical.

## 2023-10-29 NOTE — Assessment & Plan Note (Signed)
 Proceed with stress test as pending.  Given her long history of smoking, we will trial inhaler treatment.  Start Wixela 250-50 mcg, 1 puff twice daily. She will update.

## 2023-10-29 NOTE — Progress Notes (Signed)
 Subjective:    Patient ID: Bethany Sharp, female    DOB: 06-14-1946, 78 y.o.   MRN: [id number]  HPI  Emmanuel Ludlum is a very pleasant 78 y.o. female who presents today for complete physical and follow up of chronic conditions.  Her translator joins us  today.   She would like some assistance in sleeping. She has symptoms of mind racing thoughts and feeling anxious, especially before an appointment or when on vacation. Because of her symptoms, she has a hard time falling asleep. She's tried Melatonin in the past without improvement. She is interested in trying Trazodone .  She feels well-managed on sertraline  daily.  She has chronic dyspnea with any activity. She is pending a stress test for this per cardiology. Also a smoker, has smoked for decades, smokes 3-4 cigarettes daily, sometimes will go for weeks without smoking. She has been smoking more consistently recently.  She is due for repeat CT chest for follow-up of lung nodule.  She has never tried inhaler treatment for her symptoms.  She would also like a new dermatologist.  She was not pleased with her prior dermatologist at [facility] dermatology.  She continues to experience chronic allergy symptoms in the form of itchy/watery eyes, rhinorrhea, postnasal drip.  Currently managed on Xyzal  5 mg daily.  She was prescribed carbinoxamine  maleate 4 mg twice daily in 2023, she does not recall ever taking this.  Immunizations:  -Influenza: Declines  -Shingles: Declines -Pneumonia: Declines   Diet: Fair diet.  Exercise: No regular exercise.  Eye exam: Completes annually  Dental exam: Completes semi-annually     Mammogram: Completed in [place] in 2023 Bone Density Scan: Scheduled for March 2025  Colonoscopy: Completed in 2023, due 2026 Lung Cancer Screening: Due for repeat CT chest in February 2025.  Orders placed.  BP Readings from Last 3 Encounters:  10/29/23 128/84  09/09/23 128/80  09/01/23 132/70       Review of Systems   Constitutional:  Negative for unexpected weight change.  HENT:  Negative for rhinorrhea.   Eyes:  Positive for itching.  Respiratory:  Positive for shortness of breath. Negative for cough.   Cardiovascular:  Negative for chest pain.  Gastrointestinal:  Negative for constipation and diarrhea.  Genitourinary:  Negative for difficulty urinating.  Musculoskeletal:  Negative for arthralgias and myalgias.  Skin:  Negative for rash.  Allergic/Immunologic: Positive for environmental allergies.  Neurological:  Negative for dizziness, numbness and headaches.  Psychiatric/Behavioral:  Positive for sleep disturbance. The patient is not nervous/anxious.          Past Medical History:  Diagnosis Date   Allergies    Anal fissure    Anxiety    Arthritis    CAD (coronary artery disease)    Clotting disorder (HCC)    Colon polyps    Distal radius fracture, left 10/18/2022   Diverticulosis    GERD (gastroesophageal reflux disease)    HTN (hypertension)    Hyperlipidemia    Internal hemorrhoids    Sleep apnea    wears c-pap   Sleep apnea with use of continuous positive airway pressure (CPAP)    Thyroid  disease    hypothyroidism   Tubular adenoma of colon     Social History   Socioeconomic History   Marital status: Divorced    Spouse name: Not on file   Number of children: 1   Years of education: Not on file   Highest education level: Not on file  Occupational History   Not on  file  Tobacco Use   Smoking status: Every Day    Current packs/day: 0.25    Average packs/day: 0.3 packs/day for 63.0 years (15.8 ttl pk-yrs)    Types: Cigarettes    Start date: 1962   Smokeless tobacco: Never   Tobacco comments:    smokes about 4 cigs a day  Vaping Use   Vaping status: Never Used  Substance and Sexual Activity   Alcohol use: Yes    Comment: wine 2 glasses per day   Drug use: Never   Sexual activity: Not on file  Other Topics Concern   Not on file  Social History Narrative    Not on file   Social Drivers of Health   Financial Resource Strain: Not on file  Food Insecurity: Not on file  Transportation Needs: Not on file  Physical Activity: Not on file  Stress: Not on file  Social Connections: Not on file  Intimate Partner Violence: Not on file    Past Surgical History:  Procedure Laterality Date   APPENDECTOMY  1960   CESAREAN SECTION     COLONOSCOPY     tummy tuck      Family History  Problem Relation Age of Onset   Coronary artery disease Mother    Colon cancer Sister    Esophageal cancer Neg Hx    Rectal cancer Neg Hx    Stomach cancer Neg Hx    Pancreatic cancer Neg Hx     No Known Allergies  Current Outpatient Medications on File Prior to Visit  Medication Sig Dispense Refill   aspirin  EC (ASPIRIN  LOW DOSE) 81 MG tablet TAKE 1 TABLET (81 MG TOTAL) BY MOUTH DAILY. SWALLOW WHOLE. 15 tablet 0   atorvastatin  (LIPITOR) 20 MG tablet Take 1 tablet (20 mg total) by mouth daily. 90 tablet 4   ibuprofen  (ADVIL ) 600 MG tablet Take 1 tablet (600 mg total) by mouth every 8 (eight) hours as needed. 30 tablet 0   irbesartan  (AVAPRO ) 75 MG tablet Take 1 tablet (75 mg total) by mouth daily. For blood pressure. 90 tablet 1   isosorbide  mononitrate (IMDUR ) 30 MG 24 hr tablet Take 0.5 tablets (15 mg total) by mouth daily. 45 tablet 4   methocarbamol  (ROBAXIN ) 500 MG tablet Take 1 tablet (500 mg total) by mouth 2 (two) times daily. 20 tablet 0   omeprazole  (PRILOSEC) 40 MG capsule TAKE 1 CAPSULE (40 MG TOTAL) BY MOUTH 2 (TWO) TIMES DAILY BEFORE A MEAL. 180 capsule 0   sertraline  (ZOLOFT ) 50 MG tablet TAKE 1 TABLET (50 MG TOTAL) BY MOUTH DAILY. FOR ANXIETY. 90 tablet 3   fluticasone  (FLONASE ) 50 MCG/ACT nasal spray Place 1 spray into both nostrils 2 (two) times daily. (Patient not taking: Reported on 10/29/2023) 16 g 5   metoprolol  tartrate (LOPRESSOR ) 25 MG tablet Take 1 tablet (25 mg total) by mouth 2 (two) times daily. (Patient not taking: Reported on  10/29/2023) 180 tablet 3   No current facility-administered medications on file prior to visit.    BP 128/84   Pulse 65   Temp (!) 97 F (36.1 C) (Temporal)   Ht 5\' 5"  (1.651 m)   Wt 163 lb (73.9 kg)   SpO2 98%   BMI 27.12 kg/m  Objective:   Physical Exam HENT:     Right Ear: Tympanic membrane and ear canal normal.     Left Ear: Tympanic membrane and ear canal normal.  Eyes:     Pupils: Pupils are equal,  round, and reactive to light.  Cardiovascular:     Rate and Rhythm: Normal rate and regular rhythm.  Pulmonary:     Effort: Pulmonary effort is normal.     Breath sounds: Normal breath sounds.  Abdominal:     General: Bowel sounds are normal.     Palpations: Abdomen is soft.     Tenderness: There is no abdominal tenderness.  Musculoskeletal:        General: Normal range of motion.     Cervical back: Neck supple.  Skin:    General: Skin is warm and dry.  Neurological:     Mental Status: She is alert and oriented to person, place, and time.     Cranial Nerves: No cranial nerve deficit.     Deep Tendon Reflexes:     Reflex Scores:      Patellar reflexes are 2+ on the right side and 2+ on the left side. Psychiatric:        Mood and Affect: Mood normal.           Assessment & Plan:  Encounter for annual general medical examination with abnormal findings in adult Assessment & Plan: Declines immunizations  Mammogram and bone density scan due, orders placed. Colonoscopy UTD, due 2026  Discussed the importance of a healthy diet and regular exercise in order for weight loss, and to reduce the risk of further co-morbidity.  Exam stable. Labs pending.  Follow up in 1 year for repeat physical.    Coronary artery disease of native artery of native heart with stable angina pectoris [facility]) Assessment & Plan: Asymptomatic. Following with cardiology, office notes reviewed from August 2024.  Continue aspirin  81 mg daily, atorvastatin  20 mg, isosorbide  mononitrate 15  mg BID.   Lung nodule Assessment & Plan: Reviewed CT chest from February 2024. Repeat CT chest ordered and pending.  If nodule is stable, then repeat CT in 18 to 24 months.  Orders: -     CT CHEST WO CONTRAST; Future  Screening mammogram for breast cancer -     3D Screening Mammogram, Left and Right; Future  Essential hypertension Assessment & Plan: Controlled.  Continue Irbesartan  75 mg daily, metoprolol  tartrate 25 mg BID.   Orders: -     CBC -     Comprehensive metabolic panel -     Hemoglobin A1c  Hypothyroidism, unspecified type Assessment & Plan: Repeat TSH pending.  Continue levothyroxine  50 mcg daily.  Orders: -     TSH -     Levothyroxine  Sodium; Take 1 tablet by mouth every morning on an empty stomach with water only.  No food or other medications for 30 minutes.  Dispense: 90 tablet; Refill: 3  Hyperlipidemia, unspecified hyperlipidemia type Assessment & Plan: Repeat lipid panel pending. Continue atorvastatin  20 mg daily.  Orders: -     Lipid panel  Multiple nevi -     Ambulatory referral to Dermatology  GAD (generalized anxiety disorder) Assessment & Plan: Overall controlled on a day-to-day basis.  Continue sertraline  50 mg daily. Add trazodone  50 mg at bedtime as needed.  Orders: -     traZODone  HCl; Take 1 tablet (50 mg total) by mouth at bedtime as needed for sleep.  Dispense: 90 tablet; Refill: 0  Exertional dyspnea Assessment & Plan: Proceed with stress test as pending.  Given her long history of smoking, we will trial inhaler treatment.  Start Wixela 250-50 mcg, 1 puff twice daily. She will update.  Orders: -  Fluticasone -Salmeterol; Inhale 1 puff into the lungs in the morning and at bedtime.  Dispense: 180 each; Refill: 0  Environmental and seasonal allergies Assessment & Plan: Uncontrolled.  Stop Xyzal  5 mg daily.  Start carbinoxamine  4 mg twice daily. She will update.  Consider referral back to allergist if no  improvement.  Orders: -     Carbinoxamine  Maleate; Take 1 tablet (4 mg total) by mouth 2 (two) times daily. For allergies.  Dispense: 180 tablet; Refill: 3  OSA (obstructive sleep apnea) Assessment & Plan: Continue CPAP machine nightly.   Gastroesophageal reflux disease with esophagitis without hemorrhage Assessment & Plan: Stable.  Continue omeprazole  40 mg twice daily.   Tobacco abuse Assessment & Plan: Repeat CT lung nodule scan follow-up due in February. Orders placed.         Mouna Yager K Hartlee Amedee, NP

## 2023-10-29 NOTE — Assessment & Plan Note (Signed)
 -  Continue CPAP machine nightly.

## 2023-10-29 NOTE — Assessment & Plan Note (Signed)
 Uncontrolled.  Stop Xyzal  5 mg daily.  Start carbinoxamine  4 mg twice daily. She will update.  Consider referral back to allergist if no improvement.

## 2023-10-29 NOTE — Assessment & Plan Note (Signed)
 Controlled.  Continue Irbesartan  75 mg daily, metoprolol  tartrate 25 mg BID.

## 2023-10-29 NOTE — Assessment & Plan Note (Signed)
 Repeat CT lung nodule scan follow-up due in February. Orders placed.

## 2023-10-29 NOTE — Assessment & Plan Note (Signed)
 Repeat lipid panel pending. Continue atorvastatin 20 mg daily.

## 2023-10-29 NOTE — Patient Instructions (Addendum)
 Appelez [facility] en dbut de semaine prochaine pour [employer]. Dites-leur que j'ai plac une rfrence.  Essayez le collyre Pataday pour les allergies.  Arrtez de prendre de la lvoctirizine (Xyzal ) 5 mg en cas d'allergies.  Commencez  prendre Carbinoxamine  4 mg deux fois par jour [employer] allergies.   Vous pouvez prendre Trazodone  50 mg au coucher si ncessaire pour dormir.  Allez au laboratoire aujourd'hui.  Appelez [facility] pour planifier votre mammographie avec votre analyse de densit osseuse.  Vous recevrez un appel tlphonique pour [employer] de vos poumons.  dmarrez l'inhalateur de fluticasone -salmetrol (Wixela) en cas d'essoufflement. Inspirez 1 bouffe dans les poumons deux fois par [employer]. Rincez-vous la bouche aprs utilisation.  C'tait ravi de te voir !   Call [facility] early next week for your appointment. Tell them that I placed a referral.  Try Pataday eye drops for allergies.  Stop taking levocetirizine (Xyzal ) 5 mg for allergies.  Start taking Carbinoxamine  4 mg twice daily for allergies.   You may take Trazodone  50 mg at bedtime as needed for sleep.  Go to the lab today.  Call [facility] to schedule your mammogram with your bone density scan.  You will receive a phone call for the CT scan of your lungs.  start the fluticasone -salmetrol (Wixela) inhaler for shortness of breath. Inhale 1 puff into the lungs twice daily. Rinse your mouth after use.  It was lovely to see you!

## 2023-11-05 ENCOUNTER — Other Ambulatory Visit: Payer: 59

## 2023-11-06 DIAGNOSIS — G4733 Obstructive sleep apnea (adult) (pediatric): Secondary | ICD-10-CM | POA: Diagnosis not present

## 2023-11-08 ENCOUNTER — Other Ambulatory Visit: Payer: Self-pay | Admitting: Primary Care

## 2023-11-08 DIAGNOSIS — E039 Hypothyroidism, unspecified: Secondary | ICD-10-CM

## 2023-11-08 DIAGNOSIS — F411 Generalized anxiety disorder: Secondary | ICD-10-CM

## 2023-11-18 ENCOUNTER — Encounter: Payer: Self-pay | Admitting: *Deleted

## 2023-11-19 ENCOUNTER — Ambulatory Visit: Payer: 59

## 2023-11-21 ENCOUNTER — Other Ambulatory Visit: Payer: Self-pay | Admitting: Internal Medicine

## 2023-11-25 ENCOUNTER — Other Ambulatory Visit: Payer: Self-pay | Admitting: Internal Medicine

## 2023-11-26 NOTE — Telephone Encounter (Signed)
Called pt son to f/u denial of Cardiac PET Scan.  Son reports pt is currently sick with FLU/ COVID like symptoms.  Increased cough, drainage and loss of appetite.  Advised I was going to schedule a sooner OV with MD but if actively sick will need to hold off.  Advised son if she c/o CP or pressure to let me know and I will reschedule before scheduled 12/16/23 OV.  He reports she has not c/o CP/ pressure but will check in with her and will contact our office if sooner appointment is needed.

## 2023-11-28 ENCOUNTER — Ambulatory Visit
Admission: RE | Admit: 2023-11-28 | Discharge: 2023-11-28 | Disposition: A | Discharge status: Home / Self Care | Payer: 59 | Source: Ambulatory Visit | Attending: Primary Care | Admitting: Primary Care

## 2023-11-28 DIAGNOSIS — R918 Other nonspecific abnormal finding of lung field: Secondary | ICD-10-CM | POA: Diagnosis not present

## 2023-11-28 DIAGNOSIS — R911 Solitary pulmonary nodule: Secondary | ICD-10-CM

## 2023-11-28 DIAGNOSIS — J479 Bronchiectasis, uncomplicated: Secondary | ICD-10-CM | POA: Diagnosis not present

## 2023-11-30 ENCOUNTER — Emergency Department (HOSPITAL_BASED_OUTPATIENT_CLINIC_OR_DEPARTMENT_OTHER): Payer: 59 | Admitting: Radiology

## 2023-11-30 ENCOUNTER — Other Ambulatory Visit: Payer: Self-pay

## 2023-11-30 ENCOUNTER — Encounter (HOSPITAL_BASED_OUTPATIENT_CLINIC_OR_DEPARTMENT_OTHER): Payer: Self-pay

## 2023-11-30 ENCOUNTER — Emergency Department (HOSPITAL_BASED_OUTPATIENT_CLINIC_OR_DEPARTMENT_OTHER)
Admission: EM | Admit: 2023-11-30 | Discharge: 2023-11-30 | Disposition: A | Discharge status: Home / Self Care | Payer: 59 | Attending: Emergency Medicine | Admitting: Emergency Medicine

## 2023-11-30 DIAGNOSIS — I2510 Atherosclerotic heart disease of native coronary artery without angina pectoris: Secondary | ICD-10-CM | POA: Insufficient documentation

## 2023-11-30 DIAGNOSIS — R110 Nausea: Secondary | ICD-10-CM | POA: Diagnosis not present

## 2023-11-30 DIAGNOSIS — E039 Hypothyroidism, unspecified: Secondary | ICD-10-CM | POA: Diagnosis not present

## 2023-11-30 DIAGNOSIS — R5383 Other fatigue: Secondary | ICD-10-CM | POA: Diagnosis not present

## 2023-11-30 DIAGNOSIS — J9811 Atelectasis: Secondary | ICD-10-CM | POA: Diagnosis not present

## 2023-11-30 DIAGNOSIS — Z7982 Long term (current) use of aspirin: Secondary | ICD-10-CM | POA: Diagnosis not present

## 2023-11-30 DIAGNOSIS — Z79899 Other long term (current) drug therapy: Secondary | ICD-10-CM | POA: Diagnosis not present

## 2023-11-30 DIAGNOSIS — R0602 Shortness of breath: Secondary | ICD-10-CM | POA: Diagnosis not present

## 2023-11-30 DIAGNOSIS — J984 Other disorders of lung: Secondary | ICD-10-CM | POA: Diagnosis not present

## 2023-11-30 DIAGNOSIS — I10 Essential (primary) hypertension: Secondary | ICD-10-CM | POA: Insufficient documentation

## 2023-11-30 DIAGNOSIS — J101 Influenza due to other identified influenza virus with other respiratory manifestations: Secondary | ICD-10-CM | POA: Insufficient documentation

## 2023-11-30 LAB — CBC WITH DIFFERENTIAL/PLATELET
Abs Immature Granulocytes: 0.01 10*3/uL (ref 0.00–0.07)
Basophils Absolute: 0 10*3/uL (ref 0.0–0.1)
Basophils Relative: 0 %
Eosinophils Absolute: 0 10*3/uL (ref 0.0–0.5)
Eosinophils Relative: 0 %
HCT: 41.5 % (ref 36.0–46.0)
Hemoglobin: 14.9 g/dL (ref 12.0–15.0)
Immature Granulocytes: 0 %
Lymphocytes Relative: 21 %
Lymphs Abs: 1.3 10*3/uL (ref 0.7–4.0)
MCH: 31 pg (ref 26.0–34.0)
MCHC: 35.9 g/dL (ref 30.0–36.0)
MCV: 86.5 fL (ref 80.0–100.0)
Monocytes Absolute: 0.4 10*3/uL (ref 0.1–1.0)
Monocytes Relative: 6 %
Neutro Abs: 4.5 10*3/uL (ref 1.7–7.7)
Neutrophils Relative %: 73 %
Platelets: 249 10*3/uL (ref 150–400)
RBC: 4.8 MIL/uL (ref 3.87–5.11)
RDW: 12.7 % (ref 11.5–15.5)
WBC: 6.2 10*3/uL (ref 4.0–10.5)
nRBC: 0 % (ref 0.0–0.2)

## 2023-11-30 LAB — TROPONIN I (HIGH SENSITIVITY): Troponin I (High Sensitivity): 7 ng/L (ref <18)

## 2023-11-30 LAB — BASIC METABOLIC PANEL
Anion gap: 10 (ref 5–15)
BUN: 7 mg/dL — ABNORMAL LOW (ref 8–23)
CO2: 25 mmol/L (ref 22–32)
Calcium: 9.2 mg/dL (ref 8.9–10.3)
Chloride: 95 mmol/L — ABNORMAL LOW (ref 98–111)
Creatinine, Ser: 0.65 mg/dL (ref 0.44–1.00)
GFR, Estimated: >60 mL/min (ref >60)
Glucose, Bld: 119 mg/dL — ABNORMAL HIGH (ref 70–99)
Potassium: 4 mmol/L (ref 3.5–5.1)
Sodium: 130 mmol/L — ABNORMAL LOW (ref 135–145)

## 2023-11-30 LAB — RESP PANEL BY RT-PCR (RSV, FLU A&B, COVID)  RVPGX2
Influenza A by PCR: POSITIVE — AB
Influenza B by PCR: NEGATIVE
Resp Syncytial Virus by PCR: NEGATIVE
SARS Coronavirus 2 by RT PCR: NEGATIVE

## 2023-11-30 MED ORDER — SODIUM CHLORIDE 0.9 % IV BOLUS
1000.0000 mL | Freq: Once | INTRAVENOUS | Status: AC
  Administered 2023-11-30: 1000 mL via INTRAVENOUS

## 2023-11-30 MED ORDER — ONDANSETRON 4 MG PO TBDP: 4.0000 mg | ORAL_TABLET | Freq: Three times a day (TID) | ORAL | 0 refills | Status: AC | PRN

## 2023-11-30 MED ORDER — ONDANSETRON HCL 4 MG/2ML IJ SOLN
4.0000 mg | Freq: Once | INTRAMUSCULAR | Status: AC
  Administered 2023-11-30: 4 mg via INTRAVENOUS
  Filled 2023-11-30: qty 2

## 2023-11-30 MED ORDER — ALBUTEROL SULFATE HFA 108 (90 BASE) MCG/ACT IN AERS: 2.0000 | INHALATION_SPRAY | RESPIRATORY_TRACT | Status: DC | PRN

## 2023-11-30 NOTE — ED Triage Notes (Addendum)
 Patient presents with c/o shortness of breath +generalized weakness, nausea without vomiting that started three days ago. Denies chest pain, dizziness, lightheadedness.   Patient speaks [place] and requested son remained bedside to interpret.

## 2023-11-30 NOTE — ED Notes (Signed)
 Discharge paperwork given and verbally understood.

## 2023-11-30 NOTE — Discharge Instructions (Signed)
 Vous avez t vu aujourd'hui pour votre plainte de symptmes grippaux, de faiblesse. Vos analyses de [employer] t positives pour la grippe A. Elles ont par ailleurs t rassurantes. Votre imagerie a Stage manager lgre de l'espace arien gauche qui est probablement virale et cause par la grippe. Vos mdicaments de sortie comprennent le Zofran. C'est un mdicament contre les nauses. Prenez-le selon vos besoins. Alternez le tylenol et l'ibuprofne pour [employer] et la fivre. Vous pouvez les alterner toutes les 4 heures. Vous pouvez prendre jusqu' 800 mg d'ibuprofne  la fois et jusqu' 1 000 mg de tylenol. Suivi auprs de : Un fournisseur de soins primaires cette semaine Veuillez consulter immdiatement un mdecin si vous dveloppez l'un des symptmes suivants : Nouveaux symptmes ou aggravations des symptmes  l'heure actuelle, il ne semble pas y avoir de problme mdical mergent, mais il existe toujours un risque que les conditions changent. Veuillez lire et suivre les instructions ci-dessous.  Ne prenez pas votre mdicament si vous dveloppez une ruption Corporate treasurer, un gonflement de la bouche ou des lvres ou des difficults respiratoires ; Nutritional therapist 911 et [employer] immdiatement un mdecin d'urgence si cela se produit.  Vous pouvez consulter vos rsultats d'analyses de laboratoire et [employer] intgralit sur votre compte MyChart. Veuillez discuter de tous les rsultats avec votre mdecin traitant et un autre spcialiste lors de votre visite de [place].  Remarque : certaines parties de ce texte Archivist t transcrites  l'aide d'un logiciel de Dietitian. Tous les efforts ont t faits pour Administrator ; cependant, des erreurs de transcription informatise par [employer] toujours tre prsentes.  You have been seen today for your complaint of flulike symptoms, weakness. Your lab work was positive for influenza A.   Was otherwise reassuring. Your imaging showed maybe mild left airspace disease which is likely viral and caused by influenza. Your discharge medications include Zofran.  This is a nausea medicine.  Take it as needed. Alternate tylenol and ibuprofen for pain and fevers. You may alternate these every 4 hours. You may take up to 800 mg of ibuprofen at a time and up to 1000 mg of tylenol. Follow up with: A primary care provider this week Please seek immediate medical care if you develop any of the following symptoms: New or worsening symptoms At this time there does not appear to be the presence of an emergent medical condition, however there is always the potential for conditions to change. Please read and follow the below instructions.  Do not take your medicine if  develop an itchy rash, swelling in your mouth or lips, or difficulty breathing; call 911 and seek immediate emergency medical attention if this occurs.  You may review your lab tests and imaging results in their entirety on your MyChart account.  Please discuss all results of fully with your primary care provider and other specialist at your follow-up visit.  Note: Portions of this text may have been transcribed using voice recognition software. Every effort was made to ensure accuracy; however, inadvertent computerized transcription errors may still be present.

## 2023-11-30 NOTE — ED Provider Notes (Signed)
 [place] EMERGENCY DEPARTMENT AT [facility] Provider Note   CSN: [id number] Arrival date & time: 11/30/23  1156     History  Chief Complaint  Patient presents with   Shortness of Breath    Bethany Sharp is a 78 y.o. female.  With a extensive past medical history including but not limited to hypertension, CAD, hyperlipidemia, GERD, anxiety, hypothyroidism presenting to the ED for evaluation of flulike symptoms including shortness of breath, fatigue, nausea.  Symptoms initially began 1 week ago, but worsened 3 days ago.  States she has been too weak to make herself food over the past couple of days.  Cough is nonproductive.  She denies any fevers or chills.  No chest pain or shortness of breath.  No vomiting.  No abdominal pain.  History is provided by the patient and son at bedside   Shortness of Breath Associated symptoms: cough        Home Medications Prior to Admission medications   Medication Sig Start Date End Date Taking? Authorizing Provider  ondansetron (ZOFRAN-ODT) 4 MG disintegrating tablet Take 1 tablet (4 mg total) by mouth every 8 (eight) hours as needed for nausea or vomiting. 11/30/23  Yes Jeorge Reister, Edsel Petrin, PA-C  aspirin EC (ASPIRIN LOW DOSE) 81 MG tablet TAKE 1 TABLET (81 MG TOTAL) BY MOUTH DAILY. SWALLOW WHOLE. 04/28/23   Riley Lam A, MD  atorvastatin (LIPITOR) 20 MG tablet Take 1 tablet (20 mg total) by mouth daily. 06/13/23   Chandrasekhar, Rondel Jumbo, MD  Carbinoxamine Maleate 4 MG TABS Take 1 tablet (4 mg total) by mouth 2 (two) times daily. For allergies. 10/29/23   Doreene Nest, NP  fluticasone (FLONASE) 50 MCG/ACT nasal spray Place 1 spray into both nostrils 2 (two) times daily. Patient not taking: Reported on 10/29/2023 01/16/22   Doreene Nest, NP  fluticasone-salmeterol [facility] INHUB) 250-50 MCG/ACT AEPB Inhale 1 puff into the lungs in the morning and at bedtime. 10/29/23   Doreene Nest, NP  ibuprofen (ADVIL) 600 MG  tablet Take 1 tablet (600 mg total) by mouth every 8 (eight) hours as needed. 09/09/23   Eden Emms, NP  irbesartan (AVAPRO) 75 MG tablet Take 1 tablet (75 mg total) by mouth daily. For blood pressure. 10/20/23   Christell Constant, MD  isosorbide mononitrate (IMDUR) 30 MG 24 hr tablet Take 0.5 tablets (15 mg total) by mouth daily. 06/20/23   Christell Constant, MD  levothyroxine (SYNTHROID) 50 MCG tablet Take 1 tablet by mouth every morning on an empty stomach with water only.  No food or other medications for 30 minutes. 10/29/23   Doreene Nest, NP  methocarbamol (ROBAXIN) 500 MG tablet Take 1 tablet (500 mg total) by mouth 2 (two) times daily. 02/24/22   Cardama, Amadeo Garnet, MD  metoprolol tartrate (LOPRESSOR) 25 MG tablet Take 1 tablet (25 mg total) by mouth 2 (two) times daily. Patient not taking: Reported on 10/29/2023 09/01/23 11/30/23  Riley Lam A, MD  omeprazole (PRILOSEC) 40 MG capsule TAKE 1 CAPSULE (40 MG TOTAL) BY MOUTH 2 (TWO) TIMES DAILY BEFORE A MEAL. 10/23/23   Pyrtle, Carie Caddy, MD  sertraline (ZOLOFT) 50 MG tablet TAKE 1 TABLET (50 MG TOTAL) BY MOUTH DAILY. FOR ANXIETY. 11/08/23   Doreene Nest, NP  traZODone (DESYREL) 50 MG tablet Take 1 tablet (50 mg total) by mouth at bedtime as needed for sleep. 10/29/23   Doreene Nest, NP      Allergies  Patient has no known allergies.    Review of Systems   Review of Systems  Constitutional:  Positive for fatigue.  HENT:  Positive for congestion and rhinorrhea.   Respiratory:  Positive for cough and shortness of breath.   All other systems reviewed and are negative.   Physical Exam Updated Vital Signs BP 132/74   Pulse 64   Temp 98.3 F (36.8 C)   Resp 16   SpO2 96%  Physical Exam Vitals and nursing note reviewed.  Constitutional:      General: She is not in acute distress.    Appearance: Normal appearance. She is normal weight. She is not ill-appearing.  HENT:     Head: Normocephalic and  atraumatic.  Cardiovascular:     Rate and Rhythm: Normal rate and regular rhythm.  Pulmonary:     Effort: Pulmonary effort is normal. No respiratory distress.     Breath sounds: No decreased breath sounds, wheezing, rhonchi or rales.  Abdominal:     General: Abdomen is flat.  Musculoskeletal:        General: Normal range of motion.     Cervical back: Neck supple.     Right lower leg: No edema.     Left lower leg: No edema.  Skin:    General: Skin is warm and dry.  Neurological:     Mental Status: She is alert and oriented to person, place, and time.  Psychiatric:        Mood and Affect: Mood normal.        Behavior: Behavior normal.     ED Results / Procedures / Treatments   Labs (all labs ordered are listed, but only abnormal results are displayed) Labs Reviewed  RESP PANEL BY RT-PCR (RSV, FLU A&B, COVID)  RVPGX2 - Abnormal; Notable for the following components:      Result Value   Influenza A by PCR POSITIVE (*)    All other components within normal limits  BASIC METABOLIC PANEL - Abnormal; Notable for the following components:   Sodium 130 (*)    Chloride 95 (*)    Glucose, Bld 119 (*)    BUN 7 (*)    All other components within normal limits  CBC WITH DIFFERENTIAL/PLATELET  TROPONIN I (HIGH SENSITIVITY)    EKG EKG Interpretation Date/Time:  Sunday November 30 2023 12:16:20 EST Ventricular Rate:  72 PR Interval:  104 QRS Duration:  74 QT Interval:  430 QTC Calculation: 470 R Axis:   67  Text Interpretation: Sinus rhythm with short PR Nonspecific ST and T wave abnormality Abnormal ECG When compared with ECG of 13-Jun-2023 10:41, No significant change was found No significant change since last tracing Confirmed by Derwood Kaplan [phone]) on 11/30/2023 3:34:12 PM  Radiology DG Chest 2 View Result Date: 11/30/2023 CLINICAL DATA:  Shortness of breath, nausea, dizziness. EXAM: CHEST - 2 VIEW COMPARISON:  Chest CT dated 11/28/2023. FINDINGS: The heart size and  mediastinal contours are within normal limits. Vascular calcifications are seen in the aortic arch. There is mild left basilar atelectasis/airspace disease. The right lung is clear. No pleural effusion or pneumothorax. A chronic anterior wedge deformity of T12 is unchanged. IMPRESSION: Mild left basilar atelectasis/airspace disease. Electronically Signed   By: Romona Curls M.D.   On: 11/30/2023 13:16    Procedures Procedures    Medications Ordered in ED Medications  albuterol (VENTOLIN HFA) 108 (90 Base) MCG/ACT inhaler 2 puff (has no administration in time range)  sodium chloride 0.9 %  bolus 1,000 mL (0 mLs Intravenous Stopped 11/30/23 1559)  ondansetron (ZOFRAN) injection 4 mg (4 mg Intravenous Given 11/30/23 1513)    ED Course/ Medical Decision Making/ A&P                                 Medical Decision Making Amount and/or Complexity of Data Reviewed Labs: ordered. Radiology: ordered.  Risk Prescription drug management.  This patient presents to the ED for concern of Flulike symptoms, this involves an extensive number of treatment options, and is a complaint that carries with it a high risk of complications and morbidity.  The differential diagnosis includes Flu, COVID, RSV  My initial workup includesLabs, imaging, symptom control  Additional history obtained from: Nursing notes from this visit. Family son at bedside  I ordered, reviewed and interpreted labs which include: CBC, BMP, troponin, respiratory panel.  Respiratory panel positive for influenza A.  I ordered imaging studies including chest x-ray I independently visualized and interpreted imaging which showed potential left lower lobe airspace disease I agree with the radiologist interpretation  Cardiac Monitoring:  The patient was maintained on a cardiac monitor.  I personally viewed and interpreted the cardiac monitored which showed an underlying rhythm of: NSR  Afebrile, hemodynamically stable.  78 year old  female presenting to the ED for evaluation of fatigue after 7 days of flulike illness.  Respiratory panel positive for influenza A.  Likely source of her symptoms.  Hyponatremia likely due to decreased intake.  Remainder of lab work reassuring.  Chest x-ray does show mild left basilar airspace disease, this is likely viral in nature.  No fevers or leukocytosis or productive cough or rales to suggest bacterial pneumonia.  She reports improvement in her symptoms after treatment in the emergency department.  Prescription for Zofran sent.  She was educated on typical timeline of symptoms and supportive care.  She was encouraged to follow-up with her PCP this week.  She was given return precautions.  Stable at discharge.  At this time there does not appear to be any evidence of an acute emergency medical condition and the patient appears stable for discharge with appropriate outpatient follow up. Diagnosis was discussed with patient who verbalizes understanding of care plan and is agreeable to discharge. I have discussed return precautions with patient and son who verbalizes understanding. Patient encouraged to follow-up with their PCP within 1 week. All questions answered.  Patient's case discussed with Dr. Rhunette Croft who agrees with plan to discharge with follow-up.   Note: Portions of this report may have been transcribed using voice recognition software. Every effort was made to ensure accuracy; however, inadvertent computerized transcription errors may still be present.        Final Clinical Impression(s) / ED Diagnoses Final diagnoses:  Influenza A  Other fatigue    Rx / DC Orders ED Discharge Orders          Ordered    ondansetron (ZOFRAN-ODT) 4 MG disintegrating tablet  Every 8 hours PRN        11/30/23 1627              Michelle Piper, Cordelia Poche 11/30/23 1629    Derwood Kaplan, MD 12/01/23 1328

## 2023-12-10 ENCOUNTER — Telehealth: Payer: Self-pay | Admitting: Internal Medicine

## 2023-12-10 NOTE — Telephone Encounter (Signed)
 Received Denial letter from [employer]/Appeal Denial from [employer].  It erroneously notes that she did not have exertional chest pain or shortness of breath.  An portion of my prior not states:  ... history of nonobstructive coronary artery disease, has been asymptomatic since the initial evaluation in 2022. Recently, she presented with exertional chest discomfort. She also reported experiencing strong palpitations and shortness of breath, particularly during periods of rest such as upon waking in the morning or at night. These symptoms have been occurring for the past month and have been described as alarming. She also noted increased shortness of breath during walking.    She has an upcoming visit with my team.   If she needs inpatient care in the interim I recommend ischemic testing inpatient  Riley Lam, MD FASE [facility] Cardiologist [facility]  [address] [place], [address] [place], [place] [address] [phone]  8:16 AM

## 2023-12-12 ENCOUNTER — Ambulatory Visit: Payer: Self-pay | Admitting: Primary Care

## 2023-12-12 DIAGNOSIS — J189 Pneumonia, unspecified organism: Secondary | ICD-10-CM | POA: Diagnosis not present

## 2023-12-12 DIAGNOSIS — I872 Venous insufficiency (chronic) (peripheral): Secondary | ICD-10-CM | POA: Diagnosis not present

## 2023-12-12 NOTE — Telephone Encounter (Signed)
 Noted.

## 2023-12-12 NOTE — Telephone Encounter (Signed)
 Copied from CRM [id number]. Topic: Clinical - Red Word Triage >> Dec 12, 2023 11:40 AM Hector Shade B wrote: [facility] that prompted transfer to Nurse Triage: Patient visited ER last week? Or week before last diagnosis was the flu, patient is now experiencing SOB when coughing and son would like her to be seen in office to avoid another visit to the er   Chief Complaint: cough Symptoms: mild sob, cough Frequency:  Pertinent Negatives: Patient denies chest pain Disposition: [] ED /[x] Urgent Care (no appt availability in office) / [] Appointment(In office/virtual)/ []  [place] [facility]/ [] Home Care/ [] Refused Recommended Disposition /[] [place] [facility]/ []  Follow-up with PCP Additional Notes: Pt seen in ED on 2/16 diagnosed with flu, per son patient got better but started having a productive cough 2 days ago. Pt endorsing mild shortness of breath as well, no chest pain. No appts avail at PCP office, RN advising urgent care. Son is agreeable and will arrange pts transportation to an urgent care today.    Reason for Disposition  [1] MILD difficulty breathing (e.g., minimal/no SOB at rest, SOB with walking, pulse <100) AND [2] still present when not coughing  Answer Assessment - Initial Assessment Questions 1. ONSET: "When did the cough begin?"      2 days ago  2. SEVERITY: "How bad is the cough today?"      Getting worse  3. SPUTUM: "Describe the color of your sputum" (none, dry cough; clear, white, yellow, green)     Unsure  4. HEMOPTYSIS: "Are you coughing up any blood?" If so ask: "How much?" (flecks, streaks, tablespoons, etc.)     No  5. DIFFICULTY BREATHING: "Are you having difficulty breathing?" If Yes, ask: "How bad is it?" (e.g., mild, moderate, severe)    - MILD: No SOB at rest, mild SOB with walking, speaks normally in sentences, can lie down, no retractions, pulse < 100.    - MODERATE: SOB at rest, SOB with minimal exertion and prefers to sit, cannot lie down flat, speaks in  phrases, mild retractions, audible wheezing, pulse 100-120.    - SEVERE: Very SOB at rest, speaks in single words, struggling to breathe, sitting hunched forward, retractions, pulse > 120      Minimal shortness of breath at rest  6. FEVER: "Do you have a fever?" If Yes, ask: "What is your temperature, how was it measured, and when did it start?"     Unsure of exact temp, but has night sweats  7. CARDIAC HISTORY: "Do you have any history of heart disease?" (e.g., heart attack, congestive heart failure)      HTN  8. LUNG HISTORY: "Do you have any history of lung disease?"  (e.g., pulmonary embolus, asthma, emphysema)     No  9. PE RISK FACTORS: "Do you have a history of blood clots?" (or: recent major surgery, recent prolonged travel, bedridden)     No  10. OTHER SYMPTOMS: "Do you have any other symptoms?" (e.g., runny nose, wheezing, chest pain)       No  11. PREGNANCY: "Is there any chance you are pregnant?" "When was your last menstrual period?"       No  12. TRAVEL: "Have you traveled out of the country in the last month?" (e.g., travel history, exposures)       No  Protocols used: Cough - Acute Productive-A-AH

## 2023-12-16 ENCOUNTER — Ambulatory Visit: Payer: 59 | Attending: Internal Medicine | Admitting: Internal Medicine

## 2023-12-16 VITALS — BP 122/80 | HR 68 | Ht 64.0 in | Wt 157.0 lb

## 2023-12-16 DIAGNOSIS — I471 Supraventricular tachycardia, unspecified: Secondary | ICD-10-CM | POA: Diagnosis not present

## 2023-12-16 DIAGNOSIS — I25118 Atherosclerotic heart disease of native coronary artery with other forms of angina pectoris: Secondary | ICD-10-CM

## 2023-12-16 DIAGNOSIS — I4729 Other ventricular tachycardia: Secondary | ICD-10-CM

## 2023-12-16 NOTE — Patient Instructions (Signed)
Medication Instructions:  Your physician recommends that you continue on your current medications as directed. Please refer to the Current Medication list given to you today.  *If you need a refill on your cardiac medications before your next appointment, please call your pharmacy*   Lab Work: NONE If you have labs (blood work) drawn today and your tests are completely normal, you will receive your results only by: [place] (if you have MyChart) OR A paper copy in the mail If you have any lab test that is abnormal or we need to change your treatment, we will call you to review the results.   Testing/Procedures: NONE   Follow-Up: At [facility], you and your health needs are our priority.  As part of our continuing mission to provide you with exceptional heart care, we have created designated Provider Care Teams.  These Care Teams include your primary Cardiologist (physician) and Advanced Practice Providers (APPs -  Physician Assistants and Nurse Practitioners) who all work together to provide you with the care you need, when you need it.    Your next appointment:   6-7 months  Provider:   Rudean Haskell, MD

## 2023-12-16 NOTE — Progress Notes (Signed)
 Cardiology Office Note:  .    Date:  12/16/2023  ID:  Bethany Sharp, DOB 1946/05/22, MRN [id number] PCP: Doreene Nest, NP  [facility] Providers Cardiologist:  Christell Constant, MD     CC: F/u Chest pain Patient deferred interpretor (she does this when her son is here)  History of Present Illness: Marland Kitchen    Bethany Sharp is a 78 year old female with moderate nonobstructive coronary artery disease who presents with new chest pain and shortness of breath.  She has been experiencing new onset chest pain and shortness of breath for the past month. The symptoms are particularly noticeable during periods of rest, such as walking and waking up in the morning or at night. She describes the chest pain as intermittent pressure that is not associated with movement or activity and denies significant pain during walking. Shortness of breath is more pronounced during exertion, such as walking.  She has a history of moderate nonobstructive coronary artery disease, previously identified with a CT scan. There is no significant change in her symptoms that would suggest worsening of her coronary artery disease. No swelling in her legs, difficulty breathing while sleeping, or significant weight gain.  She has a painful ankle due to a previous fracture a few months ago and back pain from a fracture three years ago, which have limited her ability to walk and exercise, contributing to her deconditioning. She has not been able to work in her yard due to winter and her physical limitations.  There is a family history of thoracic aortic dissection in her mother.  She has a history of smoking.  Relevant histories: .  Social- comes with son, speaks [place] ROS: As per HPI.   Studies Reviewed: .   Cardiac Studies & Procedures   ______________________________________________________________________________________________        Bethany Pick  Sharp TERM MONITOR (3-14 DAYS) 01/16/2021  Narrative   Patient had a minimum heart rate of 46 bpm, maximum heart rate of 207 bpm, and average heart rate of 68 bpm.  Predominant underlying rhythm was sinus rhythm.  One run of non-sustained ventricular tachycardia occurred lasting 8 beats at longest with a max rate of 207 bpm at fastest.  Multiple (~ 380) runs of supraventricular tachycardia occurred lasting 35 seconds at longest with a max rate of 95 bpm at fastest.  Isolated PACs were occasional (1.3%), with rare couplets and triplets present.  Isolated PVCs were rare (<1.0%), with rare couplets.  Triggered and diary events associated with NSVT and PVCs.  Has many short runs of asymptomatic SVT with symptomatic PVCs and NSVT.   CT SCANS  CT CORONARY FRACTIONAL FLOW RESERVE DATA PREP 01/08/2021  Narrative EXAM: FFRCT ANALYSIS 78 year old with abnormal coronary CT  FINDINGS: FFRct analysis was performed on the original cardiac CT angiogram dataset. Diagrammatic representation of the FFRct analysis is provided in a separate PDF document in PACS. This dictation was created using the PDF document and an interactive 3D model of the results. 3D model is not available in the EMR/PACS. Normal FFR range is >0.80.  1. Left Main: Normal 0.99  2. LAD: There may be a hemodynamically significant stenosis in the mid to distal LAD 0.98, 0.85 0.76. The diagonal branches were not analyzed due to small caliber.  3. LCX: Normal 0.98, 0.95.  4. Ramus: n/a  5. RCA: Normal 0.98, 0.94, 0.91  IMPRESSION: There may be hemodynamically significant stenosis in the mid to distal LAD region.  Given the CCTA and  FFRct findings, intensive medical therapy is appropriate. Consider cardiac catheterization .  Note: These examples are not recommendations of [employer] and only provided as examples of what other customers are doing.   Electronically Signed By: Donato Schultz MD On: 01/09/2021 06:50   CT CORONARY MORPH W/CTA COR W/SCORE  01/08/2021  Addendum 01/08/2021  8:03 PM ADDENDUM REPORT: 01/08/2021 20:01  CLINICAL DATA:  78 year old female with dyspnea  EXAM: Cardiac/Coronary  CTA  TECHNIQUE: The patient was scanned on a [employer].  FINDINGS: A 120 kV prospective scan was triggered in the descending thoracic aorta at 111 HU's. Axial non-contrast 3 mm slices were carried out through the heart. The data set was analyzed on a dedicated work station and scored using the Agatson method. Gantry rotation speed was 250 msecs and collimation was .6 mm. Beta blockade and 0.8 mg of sl NTG was given. The 3D data set was reconstructed in 5% intervals of the 67-82 % of the R-R cycle. Diastolic phases were analyzed on a dedicated work station using MPR, MIP and VRT modes. The patient received 80 cc of contrast.  Aorta:  Normal size.  No calcifications.  No dissection.  Aortic Valve:  Trileaflet.  No calcifications.  Coronary Arteries:  Normal coronary origin.  Right dominance.  RCA is a large dominant artery that gives rise to PDA and PLA. There is no plaque.  Left main is a large artery that gives rise to LAD and LCX arteries.  LAD is a large vessel that has mid calcified plaque at bifurcation of second diagonal branch. There is step artifact that makes interpretation of the vessel challenging. The ostium of the diagonal appears severely stenotic.  LCX is a non-dominant artery that gives rise to one large OM1 branch. There is no plaque.  Other findings:  Normal pulmonary vein drainage into the left atrium.  Normal left atrial appendage without a thrombus.  Normal size of the pulmonary artery.  Please see radiology report for non cardiac findings.  IMPRESSION: 1. Coronary calcium score of 74. This was 67 percentile for age and sex matched control.  2. Normal coronary origin with right dominance.  3. Mid LAD calcified plaque at bifurcation with diagonal. Possible severe ostial disease of  diagonal branch (stair step artifact markedly reduces quality of study). Will attempt FFR analysis. If symptoms worsen or become more worrisome, recommend further cardiac testing.   Electronically Signed By: Donato Schultz MD On: 01/08/2021 20:01  Narrative EXAM: OVER-READ INTERPRETATION  CT CHEST  The following report is an over-read performed by radiologist Dr. Irish Lack of [facility] on 01/08/2021. This over-read does not include interpretation of cardiac or coronary anatomy or pathology. The coronary CTA interpretation by the cardiologist is attached.  COMPARISON:  None.  FINDINGS: Vascular: No significant vascular findings. Normal heart size. No pericardial effusion.  Mediastinum/Nodes: Visualized mediastinum and hilar regions demonstrate no lymphadenopathy or masses.  Lungs/Pleura: Less images demonstrate calcification along the right posteromedial lung base near the diaphragm consistent with a calcified granuloma. Visualized lungs show no evidence of pulmonary edema, consolidation, pneumothorax or pleural fluid.  Upper Abdomen: No acute abnormality.  Musculoskeletal: No chest wall mass or suspicious bone lesions identified.  IMPRESSION: No significant incidental findings. Calcified granuloma along the right posteromedial lung base near the diaphragm.  Electronically Signed: By: Irish Lack M.D. On: 01/08/2021 17:00     ______________________________________________________________________________________________      An EKG was ordered for CP and shows  no  high risk signs of ischemia  Physical Exam:    VS:  BP 122/80 (BP Location: Left Arm)   Pulse 68   Ht 5\' 4"  (1.626 m)   Wt 157 lb (71.2 kg)   SpO2 93%   BMI 26.95 kg/m    Wt Readings from Last 3 Encounters:  12/16/23 157 lb (71.2 kg)  10/29/23 163 lb (73.9 kg)  09/09/23 166 lb 12.8 oz (75.7 kg)    Gen: no distress  Neck: No JVD Cardiac: No Rubs or Gallops, no Murmur,  RRR +2 radial pulses Respiratory: Clear to auscultation bilaterally, normal effort, normal  respiratory rate GI: Soft, nontender, non-distended  MS: No  edema;  moves all extremities Integument: Skin feels warm Neuro:  At time of evaluation, alert and oriented to person/place/time/situation  Psych: Normal affect, patient feels ok   ASSESSMENT AND PLAN: .    Moderate Nonobstructive Coronary Artery Disease Bethany Sharp presents with moderate nonobstructive coronary artery disease, previously identified via CT scan. She reports new chest pain and shortness of breath, particularly during rest and exertion. EKG today is largely normal with slight changes in QRS complex likely due to lead placement. The chest pain has improved, suggesting no acute worsening of coronary artery disease. Shortness of breath persists, particularly with exertion. Given her family history of thoracic aortic dissection and smoking history, further evaluation is warranted. Discussed the risks, benefits, and alternatives of repeating the CT scan and performing a stress test. Emphasized that these tests are necessary if symptoms worsen to prevent potential complications. - If symptoms worsen, order repeat CT scan - If shortness of breath persists, consider echocardiogram  Shortness of Breath Bethany Sharp reports increased shortness of breath, particularly with exertion such as walking and climbing. No significant shortness of breath at rest or during sleep. Differential diagnosis includes deconditioning, lung-related issues due to smoking history, and potential small artery disease. Further testing may be necessary if symptoms persist or worsen. - discussed smoking cessation General Health Maintenance Bethany Sharp is advised to maintain general health through lifestyle modifications. Blood pressure and cholesterol levels are well-controlled. - Encourage smoking cessation - Encourage regular exercise as tolerated - Monitor blood  pressure and cholesterol levels regularly  Follow-up - Follow up if symptoms worsen or new symptoms develop this Fall - Consider repeat CT scan or stress test if indicated by symptoms.  Riley Lam, MD FASE [facility] Cardiologist [facility]  [address] [place], [address] [place], [place] [address] [phone]  3:22 PM

## 2023-12-17 ENCOUNTER — Ambulatory Visit
Admission: RE | Admit: 2023-12-17 | Discharge: 2023-12-17 | Disposition: A | Discharge status: Home / Self Care | Payer: 59 | Source: Ambulatory Visit | Attending: Primary Care | Admitting: Primary Care

## 2023-12-17 DIAGNOSIS — Z1231 Encounter for screening mammogram for malignant neoplasm of breast: Secondary | ICD-10-CM | POA: Diagnosis not present

## 2023-12-17 DIAGNOSIS — E2839 Other primary ovarian failure: Secondary | ICD-10-CM | POA: Diagnosis not present

## 2023-12-17 DIAGNOSIS — N958 Other specified menopausal and perimenopausal disorders: Secondary | ICD-10-CM | POA: Diagnosis not present

## 2023-12-17 DIAGNOSIS — M8588 Other specified disorders of bone density and structure, other site: Secondary | ICD-10-CM | POA: Diagnosis not present

## 2023-12-24 ENCOUNTER — Other Ambulatory Visit: Payer: Self-pay | Admitting: Primary Care

## 2023-12-24 ENCOUNTER — Other Ambulatory Visit (HOSPITAL_COMMUNITY): Payer: 59

## 2023-12-24 DIAGNOSIS — M79671 Pain in right foot: Secondary | ICD-10-CM | POA: Diagnosis not present

## 2023-12-24 DIAGNOSIS — E039 Hypothyroidism, unspecified: Secondary | ICD-10-CM

## 2023-12-24 DIAGNOSIS — M19072 Primary osteoarthritis, left ankle and foot: Secondary | ICD-10-CM | POA: Diagnosis not present

## 2023-12-24 DIAGNOSIS — S82392A Other fracture of lower end of left tibia, initial encounter for closed fracture: Secondary | ICD-10-CM | POA: Diagnosis not present

## 2023-12-25 ENCOUNTER — Other Ambulatory Visit: Payer: Self-pay | Admitting: Internal Medicine

## 2023-12-25 MED ORDER — OMEPRAZOLE 40 MG PO CPDR
40.0000 mg | DELAYED_RELEASE_CAPSULE | Freq: Two times a day (BID) | ORAL | 0 refills | Status: DC
  Filled 2023-12-25: qty 60, 30d supply, fill #0

## 2023-12-26 ENCOUNTER — Other Ambulatory Visit (HOSPITAL_COMMUNITY): Payer: Self-pay

## 2023-12-26 ENCOUNTER — Other Ambulatory Visit: Payer: Self-pay

## 2024-01-12 DIAGNOSIS — I872 Venous insufficiency (chronic) (peripheral): Secondary | ICD-10-CM | POA: Diagnosis not present

## 2024-01-12 DIAGNOSIS — L814 Other melanin hyperpigmentation: Secondary | ICD-10-CM | POA: Diagnosis not present

## 2024-01-12 DIAGNOSIS — D225 Melanocytic nevi of trunk: Secondary | ICD-10-CM | POA: Diagnosis not present

## 2024-01-12 DIAGNOSIS — L821 Other seborrheic keratosis: Secondary | ICD-10-CM | POA: Diagnosis not present

## 2024-01-12 DIAGNOSIS — L570 Actinic keratosis: Secondary | ICD-10-CM | POA: Diagnosis not present

## 2024-01-12 DIAGNOSIS — L578 Other skin changes due to chronic exposure to nonionizing radiation: Secondary | ICD-10-CM | POA: Diagnosis not present

## 2024-01-17 ENCOUNTER — Other Ambulatory Visit: Payer: Self-pay | Admitting: Primary Care

## 2024-01-17 DIAGNOSIS — R0609 Other forms of dyspnea: Secondary | ICD-10-CM

## 2024-01-18 ENCOUNTER — Other Ambulatory Visit: Payer: Self-pay | Admitting: Primary Care

## 2024-01-18 DIAGNOSIS — F411 Generalized anxiety disorder: Secondary | ICD-10-CM

## 2024-01-31 IMAGING — DX DG WRIST COMPLETE 3+V*L*
4 series · 4 of 4 positions shown · non-contrast
Comparison: None Available.

CLINICAL DATA: Recent fall with wrist pain, initial encounter

EXAM:
LEFT WRIST - COMPLETE 3+ VIEW

[wrist ap]
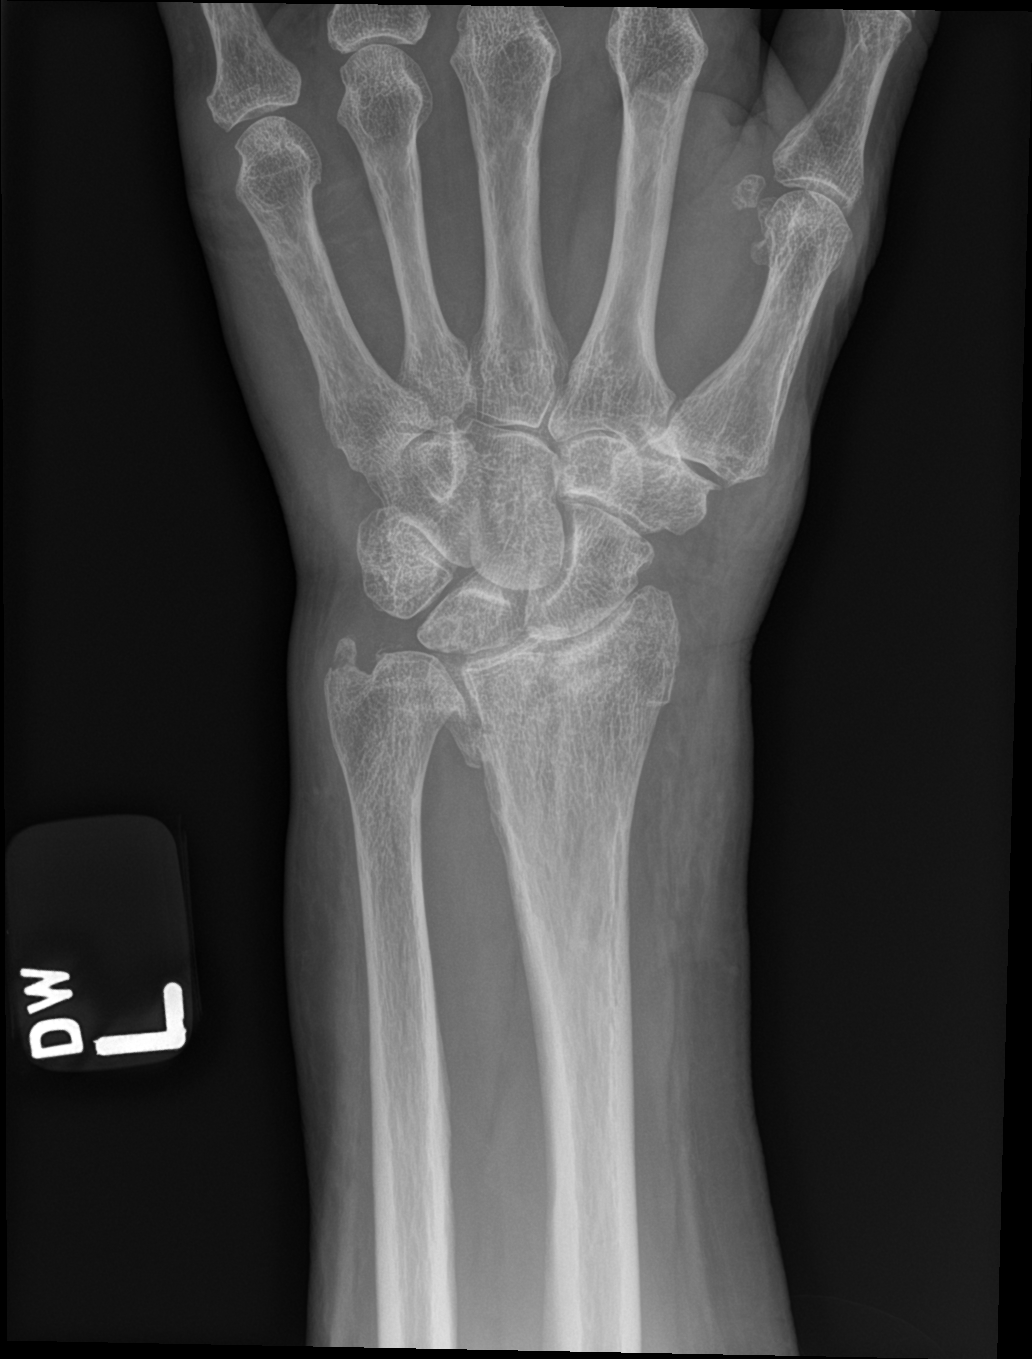

[wrist obl]
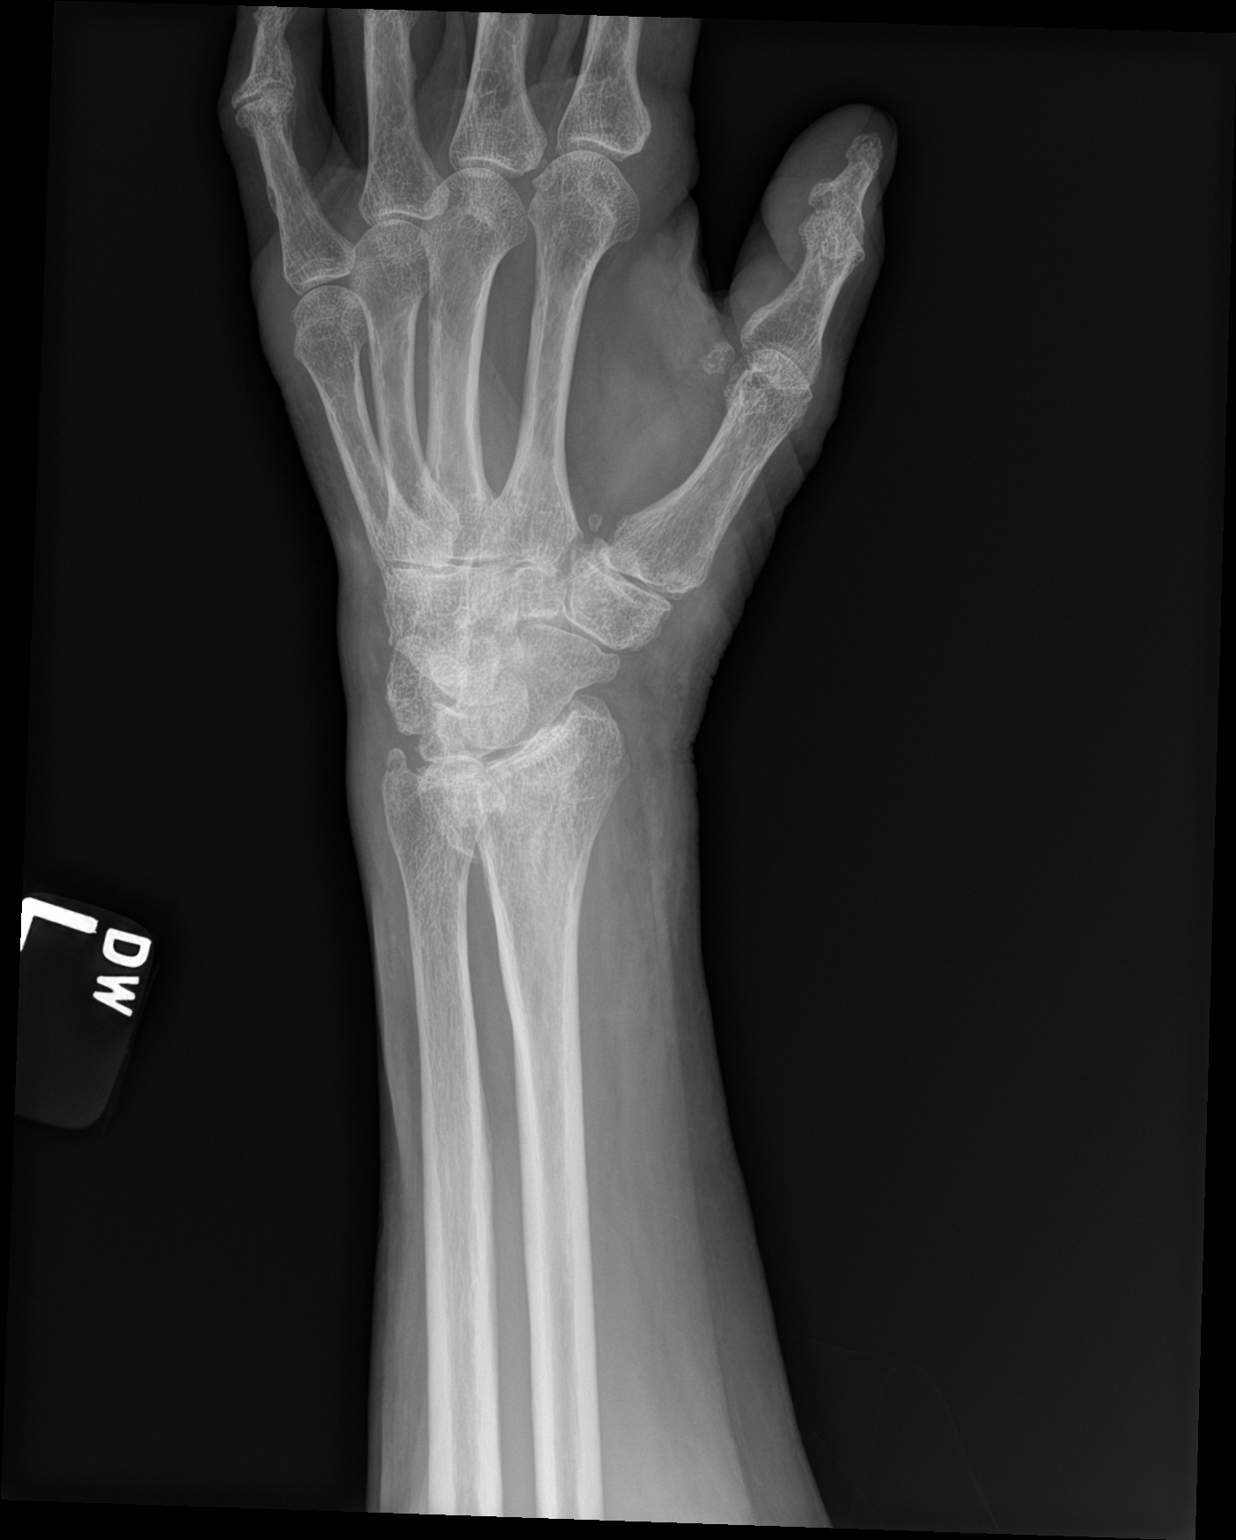

[wrist lat]
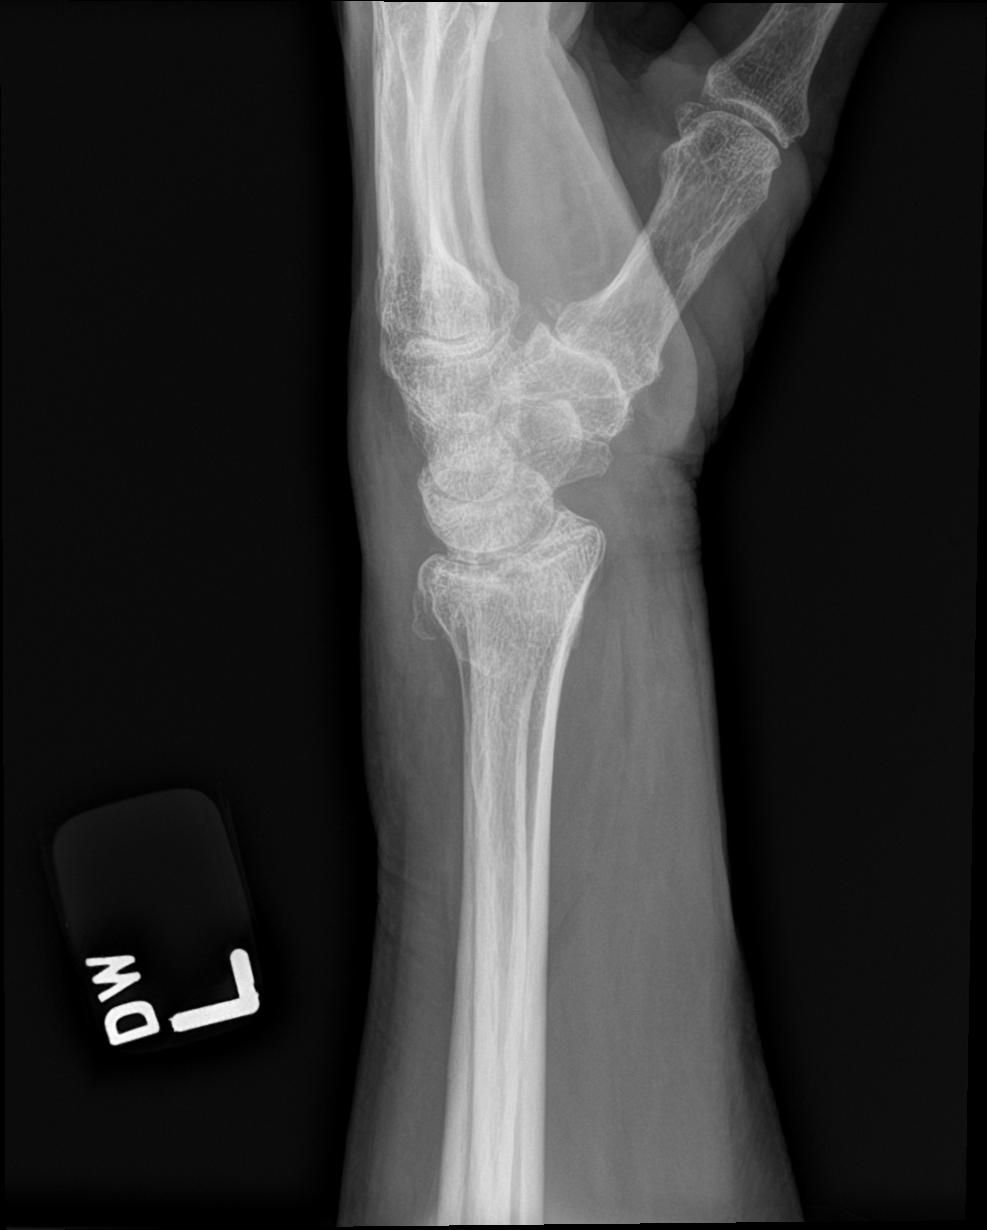

[wrist navicular]
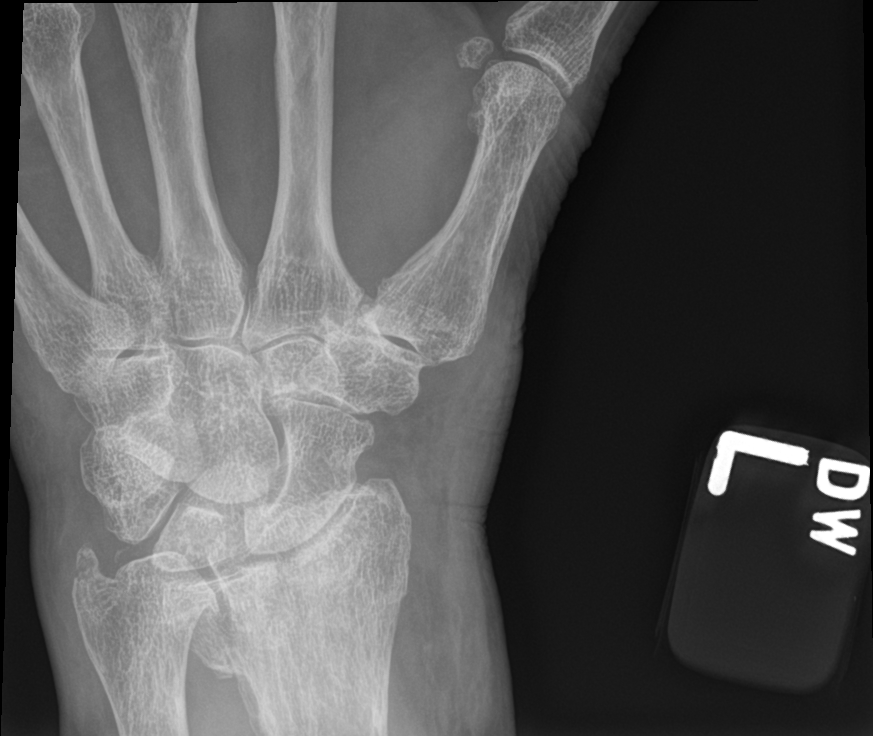

[4 of 4 positions shown; findings below may reference images not displayed]

FINDINGS: There is a comminuted fracture of the distal radius involving the
articular surface with mild impaction and posterior angulation at
the fracture site. Additionally avulsion of the ulnar styloid is
seen. No other fractures are noted.
IMPRESSION: Distal radial and ulnar fractures as described.

## 2024-02-04 ENCOUNTER — Other Ambulatory Visit (HOSPITAL_COMMUNITY): Payer: Self-pay

## 2024-02-04 ENCOUNTER — Ambulatory Visit (INDEPENDENT_AMBULATORY_CARE_PROVIDER_SITE_OTHER)
Admission: RE | Admit: 2024-02-04 | Discharge: 2024-02-04 | Disposition: A | Discharge status: Home / Self Care | Source: Ambulatory Visit | Attending: Primary Care | Admitting: Primary Care

## 2024-02-04 ENCOUNTER — Ambulatory Visit (INDEPENDENT_AMBULATORY_CARE_PROVIDER_SITE_OTHER): Admitting: Primary Care

## 2024-02-04 ENCOUNTER — Encounter: Payer: Self-pay | Admitting: Primary Care

## 2024-02-04 VITALS — BP 132/84 | HR 59 | Temp 97.9°F | Ht 64.0 in | Wt 157.0 lb

## 2024-02-04 DIAGNOSIS — G8929 Other chronic pain: Secondary | ICD-10-CM | POA: Diagnosis not present

## 2024-02-04 DIAGNOSIS — M47814 Spondylosis without myelopathy or radiculopathy, thoracic region: Secondary | ICD-10-CM | POA: Diagnosis not present

## 2024-02-04 DIAGNOSIS — M546 Pain in thoracic spine: Secondary | ICD-10-CM

## 2024-02-04 DIAGNOSIS — G4700 Insomnia, unspecified: Secondary | ICD-10-CM

## 2024-02-04 DIAGNOSIS — M4854XA Collapsed vertebra, not elsewhere classified, thoracic region, initial encounter for fracture: Secondary | ICD-10-CM | POA: Diagnosis not present

## 2024-02-04 DIAGNOSIS — K2100 Gastro-esophageal reflux disease with esophagitis, without bleeding: Secondary | ICD-10-CM | POA: Diagnosis not present

## 2024-02-04 DIAGNOSIS — F411 Generalized anxiety disorder: Secondary | ICD-10-CM | POA: Diagnosis not present

## 2024-02-04 DIAGNOSIS — M4856XA Collapsed vertebra, not elsewhere classified, lumbar region, initial encounter for fracture: Secondary | ICD-10-CM | POA: Diagnosis not present

## 2024-02-04 MED ORDER — HYDROXYZINE HCL 10 MG PO TABS: 10.0000 mg | ORAL_TABLET | Freq: Every evening | ORAL | 0 refills | Status: DC | PRN

## 2024-02-04 MED ORDER — OMEPRAZOLE 40 MG PO CPDR
40.0000 mg | DELAYED_RELEASE_CAPSULE | Freq: Two times a day (BID) | ORAL | 1 refills | Status: DC
Start: 2024-02-04 — End: 2024-08-29

## 2024-02-04 MED ORDER — CYCLOBENZAPRINE HCL 10 MG PO TABS: 10.0000 mg | ORAL_TABLET | Freq: Three times a day (TID) | ORAL | 0 refills | Status: DC | PRN

## 2024-02-04 NOTE — Assessment & Plan Note (Signed)
 Uncontrolled and with side effects on trazodone .  Start trazodone  50 mg at bedtime. Start hydroxyzine  10 mg to 20 mg at bedtime as needed.  We discussed to avoid taking cyclobenzaprine  and hydroxyzine  at bedtime together.  She will update.

## 2024-02-04 NOTE — Patient Instructions (Addendum)
 Passez une radio aujourd'hui.  Pour vos douleurs dorsales, prenez de la cyclobenzaprine , un relaxant musculaire, 10 mg trois fois par jour pour [employer]. Commencez par prendre de la cyclobenzaprine  10 mg au coucher, car elle vous rendra somnolent.  Pour dormir, vous pouvez prendre de l'hydroxyzine  10 mg au coucher. Ne prenez pas ce mdicament pendant que vous prenez de la cyclobenzaprine , un relaxant musculaire.  Matthew sera contact par tlphone pour vous orienter vers un [employer].  Veuillez me prvenir si vous n'avez pas de Therapist, art [employer] deux semaines.  Enchant de vous voir !     Go to xray today.  For your back pain, take cyclobenzaprine  muscle relaxer 10 mg three times daily for muscle spasm.  Start by taking cyclobenzaprine  10 mg at bedtime because it will make you drowsy.  For sleep, you can take hydroxyzine  10 mg at bedtime. Do not take this while taking cyclobenzaprine  muscle relaxer.  Zoila Hines will receive a phone call for the back doctor referral.  Please notify me if you do not hear from the back doctor's office within 2 weeks.  It was lovely to see you!

## 2024-02-04 NOTE — Progress Notes (Signed)
 Subjective:    Patient ID: Bethany Sharp, female    DOB: 08/09/1946, 78 y.o.   MRN: [id number]  Back Pain Pertinent negatives include no chest pain or numbness.    Bethany Sharp is a very pleasant 78 y.o. female with a history of OSA, lung nodule, tobacco use, hypertension, CAD, SVT who presents today to discuss back pain and insomnia.  She is also needing a refill of her omeprazole .  Interpreter is used today as the patient primarily speaks [place].  1) Thoracic Back Pain: Chronic for years and located to the thoracic back. Symptoms became worse about 2 weeks ago after gardening. Her pain is worse with walking, improved when laying down.   She sustained a fall in May 2023, sustained an acute T12 compression fracture. Lumbar xray with multiple degenerative changes. She never underwent kyphoplasty.   More recently she's noticed a change in her gait, feels like she has to hold onto objects with walking. She's been taking Tylenol  and Advil  without improvement. She's also been using a back brace for support. She was previously following with a chiropractor in [place], this helped.   2) Insomnia: Chronic. She is currently managed on Trazodone  50 mg for sleep for which she's been taking as needed since January 2025. Today she mentions that the Trazodone  takes time to take effect and will cause drowsiness the following day. She's tried Melatonin without improvement.    Review of Systems  Respiratory:  Negative for shortness of breath.   Cardiovascular:  Negative for chest pain.  Musculoskeletal:  Positive for arthralgias, back pain and myalgias.  Neurological:  Negative for numbness.  Psychiatric/Behavioral:  Positive for sleep disturbance.          Past Medical History:  Diagnosis Date   Allergies    Anal fissure    Anxiety    Arthritis    CAD (coronary artery disease)    Clotting disorder (HCC)    Colon polyps    Distal radius fracture, left 10/18/2022   Diverticulosis    GERD  (gastroesophageal reflux disease)    HTN (hypertension)    Hyperlipidemia    Internal hemorrhoids    Sleep apnea    wears c-pap   Sleep apnea with use of continuous positive airway pressure (CPAP)    Thyroid  disease    hypothyroidism   Tubular adenoma of colon     Social History   Socioeconomic History   Marital status: Divorced    Spouse name: Not on file   Number of children: 1   Years of education: Not on file   Highest education level: Not on file  Occupational History   Not on file  Tobacco Use   Smoking status: Every Day    Current packs/day: 0.25    Average packs/day: 0.3 packs/day for 63.3 years (15.8 ttl pk-yrs)    Types: Cigarettes    Start date: 1962   Smokeless tobacco: Never   Tobacco comments:    smokes about 4 cigs a day  Vaping Use   Vaping status: Never Used  Substance and Sexual Activity   Alcohol use: Yes    Comment: wine 2 glasses per day   Drug use: Never   Sexual activity: Not on file  Other Topics Concern   Not on file  Social History Narrative   Not on file   Social Drivers of Health   Financial Resource Strain: Not on file  Food Insecurity: Not on file  Transportation Needs: Not on file  Physical Activity: Not on file  Stress: Not on file  Social Connections: Not on file  Intimate Partner Violence: Not on file    Past Surgical History:  Procedure Laterality Date   APPENDECTOMY  1960   CESAREAN SECTION     COLONOSCOPY     tummy tuck      Family History  Problem Relation Age of Onset   Coronary artery disease Mother    Colon cancer Sister    Esophageal cancer Neg Hx    Rectal cancer Neg Hx    Stomach cancer Neg Hx    Pancreatic cancer Neg Hx     No Known Allergies  Current Outpatient Medications on File Prior to Visit  Medication Sig Dispense Refill   aspirin  EC (ASPIRIN  LOW DOSE) 81 MG tablet TAKE 1 TABLET (81 MG TOTAL) BY MOUTH DAILY. SWALLOW WHOLE. 15 tablet 0   atorvastatin  (LIPITOR) 20 MG tablet Take 1 tablet (20  mg total) by mouth daily. 90 tablet 4   Carbinoxamine  Maleate 4 MG TABS Take 1 tablet (4 mg total) by mouth 2 (two) times daily. For allergies. 180 tablet 3   fluticasone -salmeterol (WIXELA INHUB) 250-50 MCG/ACT AEPB INHALE 1 PUFF INTO THE LUNGS IN THE MORNING AND AT BEDTIME. 180 each 2   hydrocortisone 2.5 % cream Apply topically 2 (two) times daily.     ibuprofen  (ADVIL ) 600 MG tablet Take 1 tablet (600 mg total) by mouth every 8 (eight) hours as needed. 30 tablet 0   irbesartan  (AVAPRO ) 75 MG tablet Take 1 tablet (75 mg total) by mouth daily. For blood pressure. 90 tablet 1   isosorbide  mononitrate (IMDUR ) 30 MG 24 hr tablet Take 0.5 tablets (15 mg total) by mouth daily. 45 tablet 4   levocetirizine (XYZAL ) 5 MG tablet every evening.     levothyroxine  (SYNTHROID ) 50 MCG tablet TAKE 1 TABLET BY MOUTH EVERY MORNING ON EMPTY STOMACH WITH WATER ONLY. NO OTHER FOOD/MEDS FOR 30 MIN 90 tablet 2   ondansetron  (ZOFRAN -ODT) 4 MG disintegrating tablet Take 1 tablet (4 mg total) by mouth every 8 (eight) hours as needed for nausea or vomiting. 20 tablet 0   sertraline  (ZOLOFT ) 50 MG tablet TAKE 1 TABLET (50 MG TOTAL) BY MOUTH DAILY. FOR ANXIETY. 90 tablet 3   traZODone  (DESYREL ) 50 MG tablet TAKE 1 TABLET BY MOUTH AT BEDTIME AS NEEDED FOR SLEEP. 90 tablet 2   metoprolol  tartrate (LOPRESSOR ) 25 MG tablet Take 1 tablet (25 mg total) by mouth 2 (two) times daily. 180 tablet 3   No current facility-administered medications on file prior to visit.    BP 132/84   Pulse (!) 59   Temp 97.9 F (36.6 C) (Temporal)   Ht 5\' 4"  (1.626 m)   Wt 157 lb (71.2 kg)   SpO2 97%   BMI 26.95 kg/m  Objective:   Physical Exam Cardiovascular:     Rate and Rhythm: Normal rate and regular rhythm.  Musculoskeletal:     Thoracic back: No tenderness or bony tenderness. Decreased range of motion.       Back:     Comments: Altered gait in the office. Stable.   Skin:    General: Skin is warm and dry.  Neurological:      Mental Status: She is alert and oriented to person, place, and time.  Psychiatric:        Mood and Affect: Mood normal.           Assessment & Plan:  Chronic  midline thoracic back pain Assessment & Plan: With known T12 compression fracture.  Repeat thoracic x-rays ordered and pending today.  Start cyclobenzaprine  10 mg 3 times daily as needed.  Drowsiness precautions provided.  Recommended she start at bedtime.  We also discussed to avoid taking cyclobenzaprine  and hydroxyzine  together at bedtime.  Referral placed to orthopedics.  Orders: -     DG Thoracic Spine W/Swimmers -     Ambulatory referral to Orthopedic Surgery -     Cyclobenzaprine  HCl; Take 1 tablet (10 mg total) by mouth 3 (three) times daily as needed for muscle spasms.  Dispense: 30 tablet; Refill: 0  Gastroesophageal reflux disease with esophagitis without hemorrhage -     Omeprazole ; Take 1 capsule (40 mg total) by mouth 2 (two) times daily before a meal. for heartburn.  Dispense: 180 capsule; Refill: 1  Insomnia, unspecified type Assessment & Plan: Uncontrolled and with side effects on trazodone .  Start trazodone  50 mg at bedtime. Start hydroxyzine  10 mg to 20 mg at bedtime as needed.  We discussed to avoid taking cyclobenzaprine  and hydroxyzine  at bedtime together.  She will update.  Orders: -     hydrOXYzine  HCl; Take 1-2 tablets (10-20 mg total) by mouth at bedtime as needed. For sleep  Dispense: 30 tablet; Refill: 0  GAD (generalized anxiety disorder) -     hydrOXYzine  HCl; Take 1-2 tablets (10-20 mg total) by mouth at bedtime as needed. For sleep  Dispense: 30 tablet; Refill: 0        Gabriel John, NP

## 2024-02-04 NOTE — Assessment & Plan Note (Signed)
 With known T12 compression fracture.  Repeat thoracic x-rays ordered and pending today.  Start cyclobenzaprine  10 mg 3 times daily as needed.  Drowsiness precautions provided.  Recommended she start at bedtime.  We also discussed to avoid taking cyclobenzaprine  and hydroxyzine  together at bedtime.  Referral placed to orthopedics.

## 2024-02-05 DIAGNOSIS — G4733 Obstructive sleep apnea (adult) (pediatric): Secondary | ICD-10-CM | POA: Diagnosis not present

## 2024-02-16 ENCOUNTER — Telehealth: Payer: Self-pay | Admitting: Primary Care

## 2024-02-16 NOTE — Telephone Encounter (Signed)
 See result note.

## 2024-02-16 NOTE — Telephone Encounter (Signed)
 Received call report from [facility] on patient's Thoracic Xray done on 02/04/24.  IMPRESSION: 1. Age indeterminate compression fracture of T11 is new compared to chest CT 11/28/2023. 2. Chronic compression fracture of T12.

## 2024-02-17 DIAGNOSIS — M4854XA Collapsed vertebra, not elsewhere classified, thoracic region, initial encounter for fracture: Secondary | ICD-10-CM | POA: Diagnosis not present

## 2024-02-17 DIAGNOSIS — S22080A Wedge compression fracture of T11-T12 vertebra, initial encounter for closed fracture: Secondary | ICD-10-CM | POA: Diagnosis not present

## 2024-02-20 IMAGING — DX DG WRIST COMPLETE 3+V*L*
3 series · 3 of 3 positions shown · non-contrast
Comparison: February 23, 2022

CLINICAL DATA: Follow-up wrist fracture.

EXAM:
LEFT WRIST - COMPLETE 3+ VIEW

[wrist ap]
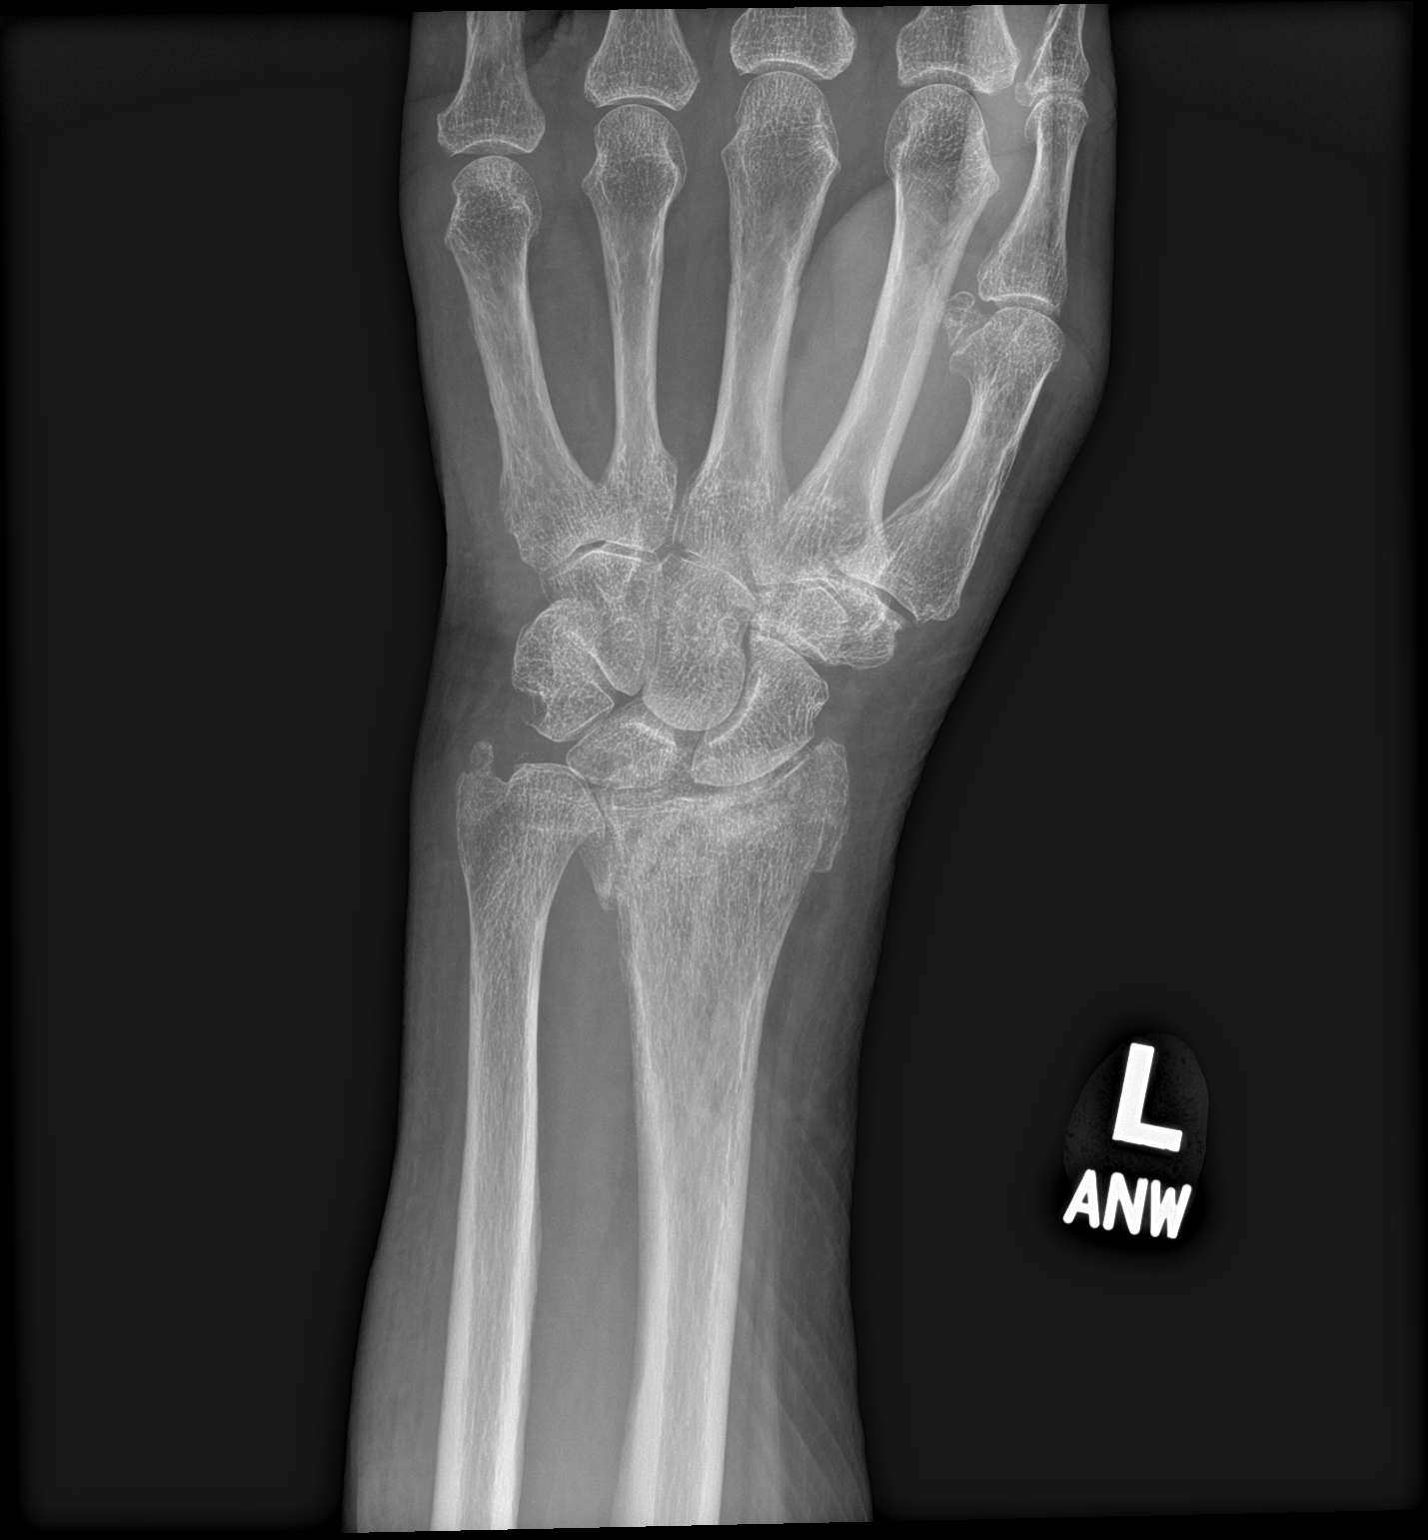

[wrist obl]
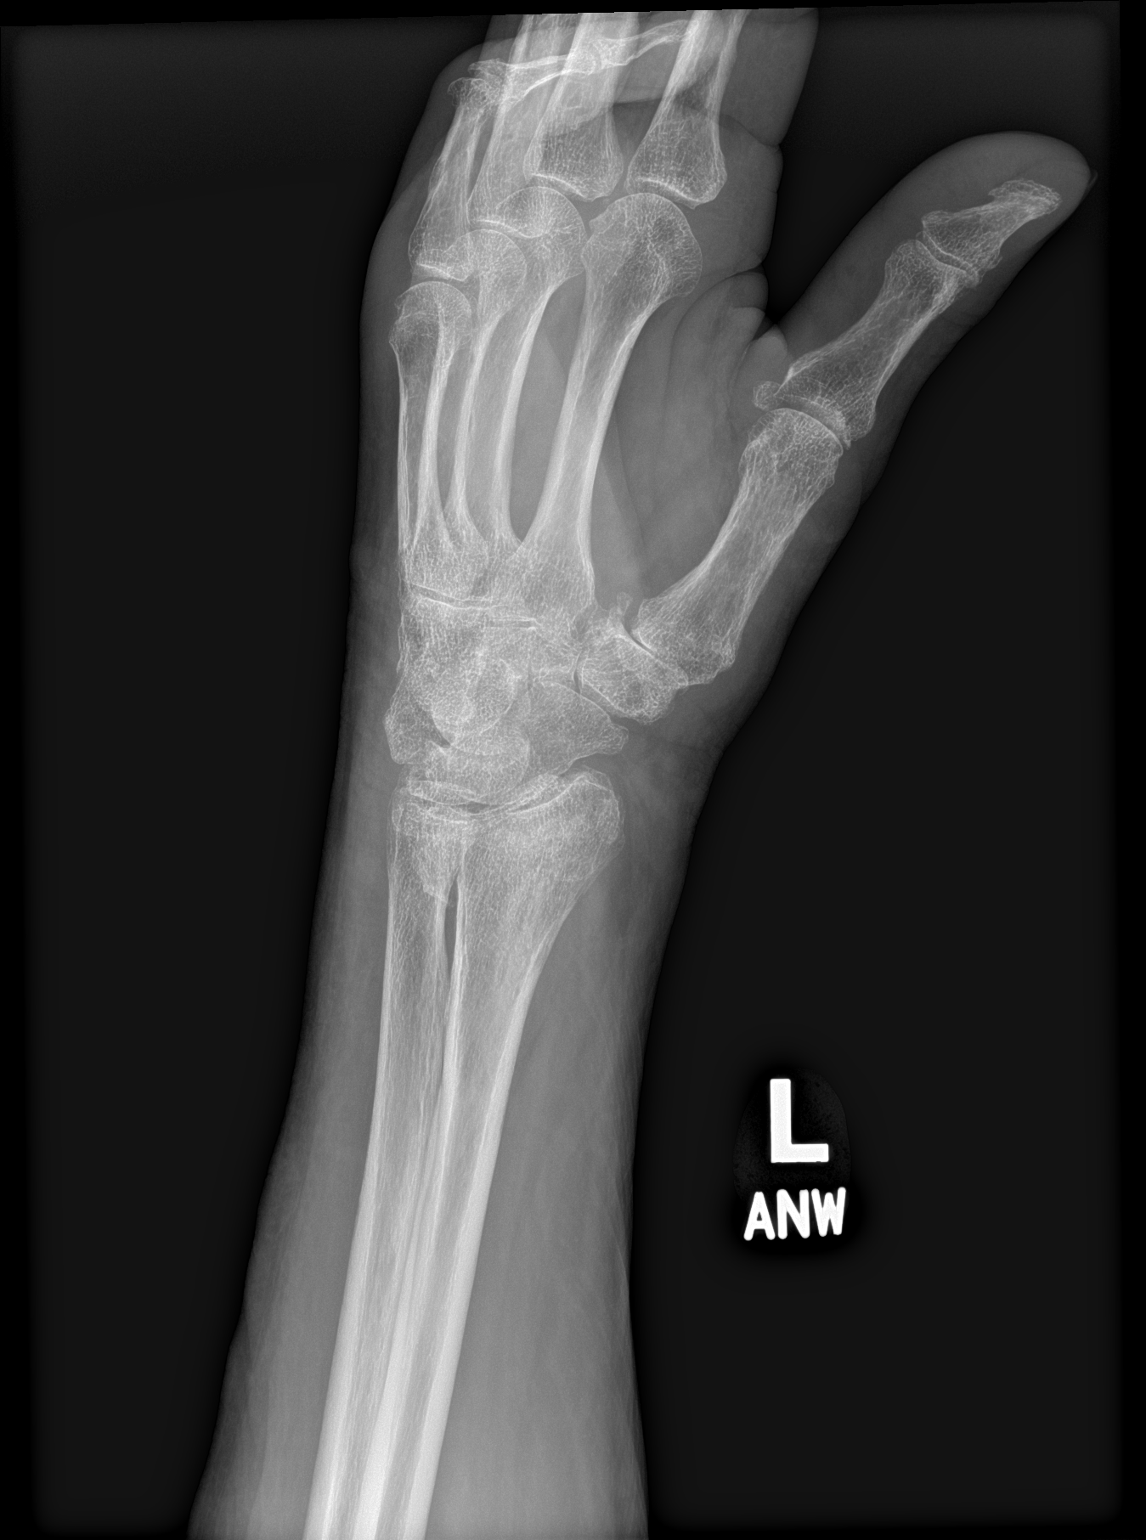

[wrist lat]
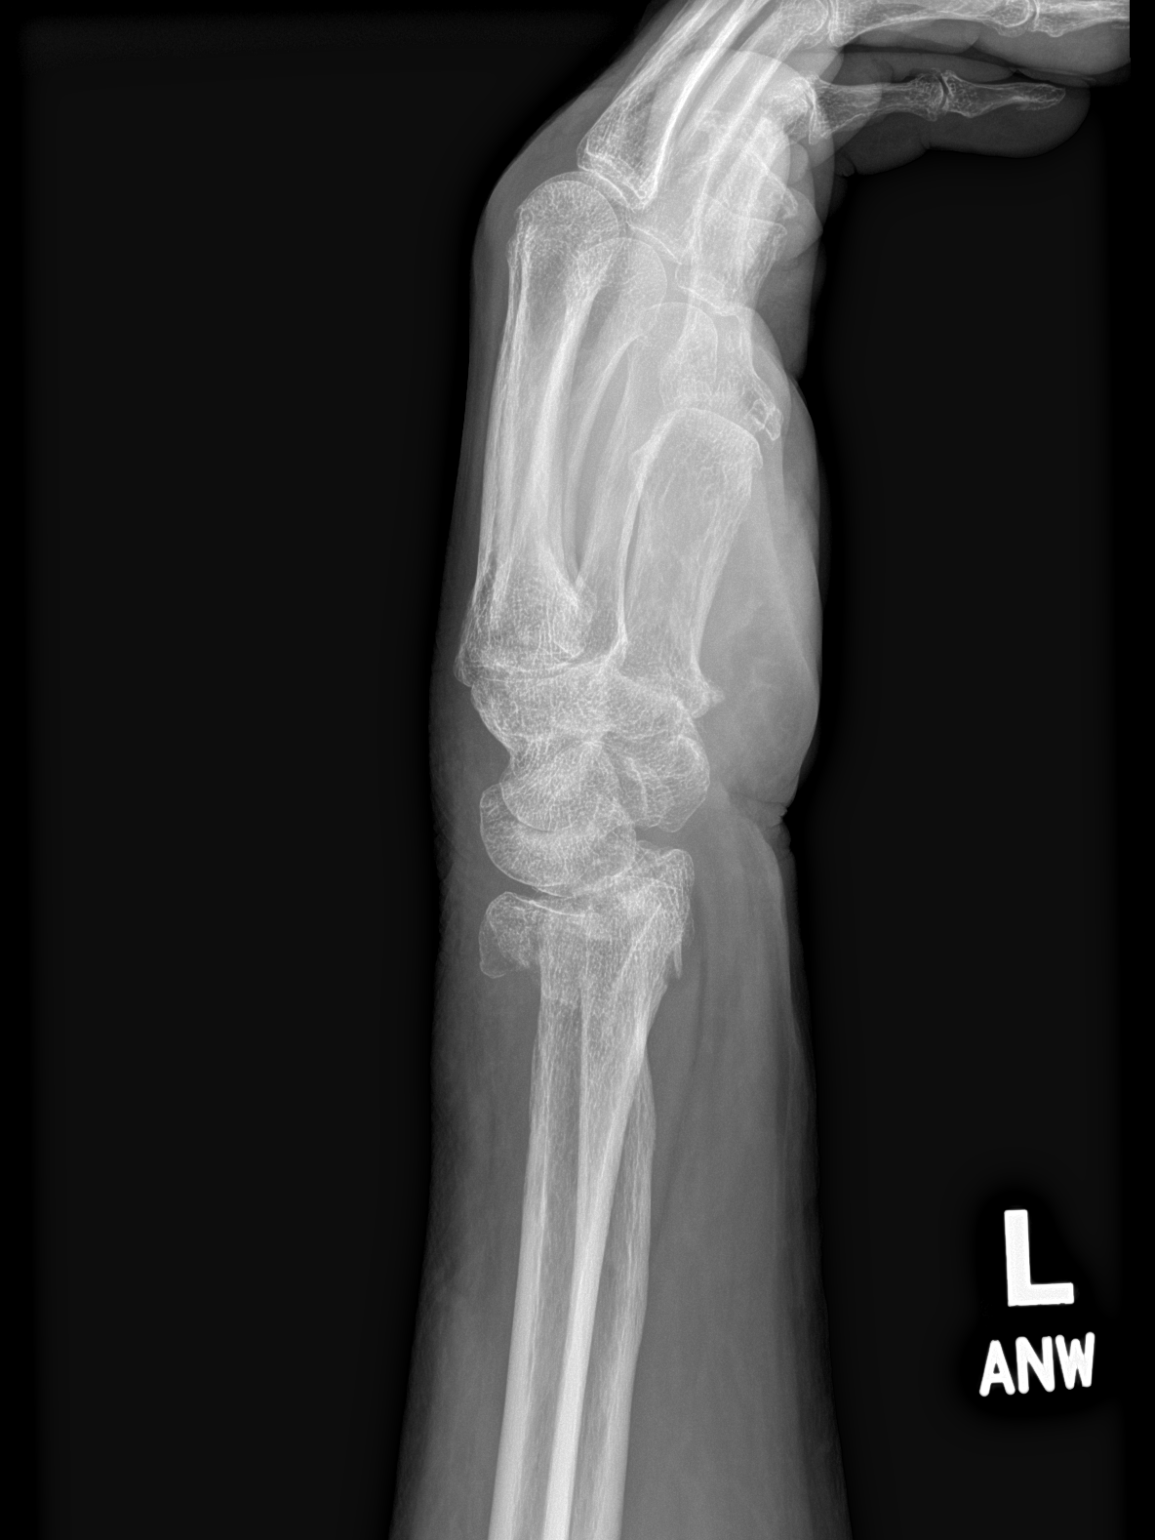

[3 of 3 positions shown; findings below may reference images not displayed]

FINDINGS: Acute fracture deformities are again seen involving the distal left
radius and left ulnar styloid. There is gross anatomical alignment
without significant callus formation seen since the prior study.
There is no evidence of dislocation. There is mild diffuse soft
tissue swelling.
IMPRESSION: Acute fracture of the distal left radius and left ulnar styloid
without significant callus formation since the prior study.

## 2024-03-19 ENCOUNTER — Other Ambulatory Visit: Payer: Self-pay | Admitting: Primary Care

## 2024-03-19 DIAGNOSIS — F411 Generalized anxiety disorder: Secondary | ICD-10-CM

## 2024-03-19 DIAGNOSIS — G4700 Insomnia, unspecified: Secondary | ICD-10-CM

## 2024-03-22 DIAGNOSIS — S22089S Unspecified fracture of T11-T12 vertebra, sequela: Secondary | ICD-10-CM

## 2024-03-22 DIAGNOSIS — G8929 Other chronic pain: Secondary | ICD-10-CM

## 2024-04-27 DIAGNOSIS — S22080A Wedge compression fracture of T11-T12 vertebra, initial encounter for closed fracture: Secondary | ICD-10-CM | POA: Diagnosis not present

## 2024-05-03 ENCOUNTER — Other Ambulatory Visit: Payer: Self-pay | Admitting: *Deleted

## 2024-05-03 MED ORDER — IRBESARTAN 75 MG PO TABS: 75.0000 mg | ORAL_TABLET | Freq: Every day | ORAL | 3 refills | Status: AC

## 2024-05-06 DIAGNOSIS — G4733 Obstructive sleep apnea (adult) (pediatric): Secondary | ICD-10-CM | POA: Diagnosis not present

## 2024-05-18 DIAGNOSIS — M5135 Other intervertebral disc degeneration, thoracolumbar region: Secondary | ICD-10-CM | POA: Diagnosis not present

## 2024-05-18 DIAGNOSIS — S22080A Wedge compression fracture of T11-T12 vertebra, initial encounter for closed fracture: Secondary | ICD-10-CM | POA: Diagnosis not present

## 2024-05-18 DIAGNOSIS — M4804 Spinal stenosis, thoracic region: Secondary | ICD-10-CM | POA: Diagnosis not present

## 2024-05-18 DIAGNOSIS — M48061 Spinal stenosis, lumbar region without neurogenic claudication: Secondary | ICD-10-CM | POA: Diagnosis not present

## 2024-05-18 DIAGNOSIS — M51369 Other intervertebral disc degeneration, lumbar region without mention of lumbar back pain or lower extremity pain: Secondary | ICD-10-CM | POA: Diagnosis not present

## 2024-05-19 ENCOUNTER — Encounter (HOSPITAL_COMMUNITY): Payer: Self-pay

## 2024-05-19 ENCOUNTER — Other Ambulatory Visit: Payer: Self-pay

## 2024-05-19 ENCOUNTER — Ambulatory Visit: Admitting: Family Medicine

## 2024-05-19 ENCOUNTER — Other Ambulatory Visit (HOSPITAL_COMMUNITY): Payer: Self-pay

## 2024-05-19 VITALS — BP 149/83 | Ht 64.96 in | Wt 154.3 lb

## 2024-05-19 DIAGNOSIS — M810 Age-related osteoporosis without current pathological fracture: Secondary | ICD-10-CM | POA: Diagnosis not present

## 2024-05-19 MED ORDER — DENOSUMAB 60 MG/ML ~~LOC~~ SOSY
60.0000 mg | PREFILLED_SYRINGE | Freq: Once | SUBCUTANEOUS | 0 refills | Status: AC
  Filled 2024-05-19: qty 1, 1d supply, fill #0
  Filled 2024-05-20: qty 1, 180d supply, fill #0
  Filled 2024-05-20: qty 1, 1d supply, fill #0

## 2024-05-19 NOTE — Patient Instructions (Addendum)
 Vous souffrez d'ostoporose. Prenez 1 200 mg de calcium  par jour et 800 units internationales de vitamine D par jour. La marche est importante. Faites des analyses de laboratoire aprs votre dpart aujourd'hui. Nous examinerons [employer]  pour vous et vous contacterons pour [employer] informer du cot et de Museum/gallery conservator.

## 2024-05-19 NOTE — Progress Notes (Signed)
 Patient to be enrolled with [facility]. However, insurance rejection states SPECIALTY DRUG:PRODUCT/SERVICE NOT APPROPRIATE FOR THIS LOCATION. MBR CALL NUMBER ON ID CARD PRODUCT/SERVICE NOT APPROPRIATE FOR THIS LOCATION. Routed to Tiffany.

## 2024-05-20 ENCOUNTER — Other Ambulatory Visit: Payer: Self-pay

## 2024-05-20 ENCOUNTER — Encounter: Payer: Self-pay | Admitting: Family Medicine

## 2024-05-20 NOTE — Progress Notes (Signed)
 Spoke with insurance and they prefer pt to fill with [facility]. Office aware. Dis-enrolling.

## 2024-05-20 NOTE — Progress Notes (Signed)
 PCP: Gretta Comer POUR, NP  Subjective:   HPI: Patient is a 78 y.o. female here for osteoporosis.  Patient was seen with interpreter. She reports history of spine and wrist fractures. Referred here from [facility] for discussion of treatment options Prior treatment: none History of Hip, Spine, or Wrist Fracture: T11, T12 and L3 compression fractures, wrist fracture Heart disease or stroke: no Cancer: no Kidney Disease: no Gastric/Peptic Ulcer: yes Gastric bypass surgery: no Severe GERD: yes History of seizures: no Age at Menopause: 49 Calcium  intake: yes but not certain of dose Vitamin D  intake: yes but not certain of dose Hormone replacement therapy: no Smoking history: long history but 1 pack per week now Alcohol: uncertain Exercise: walking and gardening Major dental work in past year: no Parents with hip/spine fracture: no  Past Medical History:  Diagnosis Date   Allergies    Anal fissure    Anxiety    Arthritis    CAD (coronary artery disease)    Clotting disorder (HCC)    Colon polyps    Distal radius fracture, left 10/18/2022   Diverticulosis    GERD (gastroesophageal reflux disease)    HTN (hypertension)    Hyperlipidemia    Internal hemorrhoids    Sleep apnea    wears c-pap   Sleep apnea with use of continuous positive airway pressure (CPAP)    Thyroid  disease    hypothyroidism   Tubular adenoma of colon     Current Outpatient Medications on File Prior to Visit  Medication Sig Dispense Refill   aspirin  EC (ASPIRIN  LOW DOSE) 81 MG tablet TAKE 1 TABLET (81 MG TOTAL) BY MOUTH DAILY. SWALLOW WHOLE. 15 tablet 0   atorvastatin  (LIPITOR) 20 MG tablet Take 1 tablet (20 mg total) by mouth daily. 90 tablet 4   Carbinoxamine  Maleate 4 MG TABS Take 1 tablet (4 mg total) by mouth 2 (two) times daily. For allergies. 180 tablet 3   cyclobenzaprine  (FLEXERIL ) 10 MG tablet Take 1 tablet (10 mg total) by mouth 3 (three) times daily as needed for muscle  spasms. 30 tablet 0   fluticasone -salmeterol (WIXELA INHUB) 250-50 MCG/ACT AEPB INHALE 1 PUFF INTO THE LUNGS IN THE MORNING AND AT BEDTIME. 180 each 2   hydrocortisone 2.5 % cream Apply topically 2 (two) times daily.     hydrOXYzine  (ATARAX ) 10 MG tablet TAKE 1-2 TABLETS (10-20 MG TOTAL) BY MOUTH AT BEDTIME AS NEEDED FOR SLEEP. **NOT COVERED** 30 tablet 0   ibuprofen  (ADVIL ) 600 MG tablet Take 1 tablet (600 mg total) by mouth every 8 (eight) hours as needed. 30 tablet 0   irbesartan  (AVAPRO ) 75 MG tablet Take 1 tablet (75 mg total) by mouth daily. For blood pressure. 90 tablet 3   isosorbide  mononitrate (IMDUR ) 30 MG 24 hr tablet Take 0.5 tablets (15 mg total) by mouth daily. 45 tablet 4   levocetirizine (XYZAL ) 5 MG tablet every evening.     levothyroxine  (SYNTHROID ) 50 MCG tablet TAKE 1 TABLET BY MOUTH EVERY MORNING ON EMPTY STOMACH WITH WATER ONLY. NO OTHER FOOD/MEDS FOR 30 MIN 90 tablet 2   metoprolol  tartrate (LOPRESSOR ) 25 MG tablet Take 1 tablet (25 mg total) by mouth 2 (two) times daily. 180 tablet 3   omeprazole  (PRILOSEC) 40 MG capsule Take 1 capsule (40 mg total) by mouth 2 (two) times daily before a meal. for heartburn. 180 capsule 1   ondansetron  (ZOFRAN -ODT) 4 MG disintegrating tablet Take 1 tablet (4 mg total) by mouth every 8 (eight)  hours as needed for nausea or vomiting. 20 tablet 0   sertraline  (ZOLOFT ) 50 MG tablet TAKE 1 TABLET (50 MG TOTAL) BY MOUTH DAILY. FOR ANXIETY. 90 tablet 3   traZODone  (DESYREL ) 50 MG tablet TAKE 1 TABLET BY MOUTH AT BEDTIME AS NEEDED FOR SLEEP. 90 tablet 2   No current facility-administered medications on file prior to visit.    Past Surgical History:  Procedure Laterality Date   APPENDECTOMY  1960   CESAREAN SECTION     COLONOSCOPY     tummy tuck      No Known Allergies  BP (!) 149/83   Ht 5' 4.96 (1.65 m)   Wt 154 lb 5.2 oz (70 kg)   BMI 25.71 kg/m       No data to display              No data to display               Objective:  Physical Exam:  Gen: NAD, comfortable in exam room   Dexa T scores 12/17/23: L femoral neck -2.1, Spine -1.9  Assessment & Plan:  1. Osteoporosis - with recent T11 compression fracture and history of T12, L3 compression fractures.  History of wrist pain as well.  Her T scores from bone density test in March are in osteopenic range and likely not severe enough to qualify for anabolic treatment despite multiple compression fractures.  History of ulcer and severe GERD so will avoid oral bisphosphonates.  Will look into Prolia  for her.  Check CBC, CMP, Vitamin D , PTH levels. Encouraged 1200mg  calcium , 800 international units of vitamin D , regular weight-bearing exercise.    Total visit time 30 minutes including documentation.

## 2024-05-21 ENCOUNTER — Telehealth: Payer: Self-pay | Admitting: *Deleted

## 2024-05-21 ENCOUNTER — Ambulatory Visit: Payer: Self-pay | Admitting: Family Medicine

## 2024-05-21 LAB — COMPREHENSIVE METABOLIC PANEL WITH GFR
ALT: 19 IU/L (ref 0–32)
AST: 22 IU/L (ref 0–40)
Albumin: 4.6 g/dL (ref 3.8–4.8)
Alkaline Phosphatase: 97 IU/L (ref 44–121)
BUN/Creatinine Ratio: 9 — ABNORMAL LOW (ref 12–28)
BUN: 6 mg/dL — ABNORMAL LOW (ref 8–27)
Bilirubin Total: 0.4 mg/dL (ref 0.0–1.2)
CO2: 21 mmol/L (ref 20–29)
Calcium: 9.8 mg/dL (ref 8.7–10.3)
Chloride: 100 mmol/L (ref 96–106)
Creatinine, Ser: 0.7 mg/dL (ref 0.57–1.00)
Globulin, Total: 2 g/dL (ref 1.5–4.5)
Glucose: 98 mg/dL (ref 70–99)
Potassium: 5.1 mmol/L (ref 3.5–5.2)
Sodium: 136 mmol/L (ref 134–144)
Total Protein: 6.6 g/dL (ref 6.0–8.5)
eGFR: 88 mL/min/1.73 (ref >59)

## 2024-05-21 LAB — CBC WITH DIFFERENTIAL/PLATELET
Basophils Absolute: 0 x10E3/uL (ref 0.0–0.2)
Basos: 1 %
EOS (ABSOLUTE): 0.4 x10E3/uL (ref 0.0–0.4)
Eos: 7 %
Hematocrit: 43.9 % (ref 34.0–46.6)
Hemoglobin: 14.1 g/dL (ref 11.1–15.9)
Immature Grans (Abs): 0 x10E3/uL (ref 0.0–0.1)
Immature Granulocytes: 0 %
Lymphocytes Absolute: 1.9 x10E3/uL (ref 0.7–3.1)
Lymphs: 29 %
MCH: 31 pg (ref 26.6–33.0)
MCHC: 32.1 g/dL (ref 31.5–35.7)
MCV: 97 fL (ref 79–97)
Monocytes Absolute: 0.5 x10E3/uL (ref 0.1–0.9)
Monocytes: 7 %
Neutrophils Absolute: 3.5 x10E3/uL (ref 1.4–7.0)
Neutrophils: 56 %
Platelets: 287 x10E3/uL (ref 150–450)
RBC: 4.55 x10E6/uL (ref 3.77–5.28)
RDW: 12.7 % (ref 11.7–15.4)
WBC: 6.3 x10E3/uL (ref 3.4–10.8)

## 2024-05-21 LAB — PARATHYROID HORMONE, INTACT (NO CA): PTH: 38 pg/mL (ref 15–65)

## 2024-05-21 LAB — VITAMIN D 25 HYDROXY (VIT D DEFICIENCY, FRACTURES): Vit D, 25-Hydroxy: 67.7 ng/mL (ref 30.0–100.0)

## 2024-05-21 NOTE — Telephone Encounter (Addendum)
 [facility] [employer] [facility] [employer]  Patient is ready for scheduling on or after: 05/26/24  Out-of-pocket cost due at time of visit: $470.55Prolia  and administration will be subject to 25% coinsurance up to a $2,000.00 out of pocket max ($1,416.77 met). Once met, coverage increases to 100%.   Primary: [employer] Prolia  co-insurance: 25% (approximately $438.55) Admin fee co-insurance: 25% (approximately $32)  Deductible: N/A  Prior Auth: Approved Case #: [id number] Valid: 05/25/24 - 05/24/25    ** This summary of benefits is an estimation of the patient's out-of-pocket cost. Exact cost may vary based on individual plan coverage.

## 2024-05-24 ENCOUNTER — Other Ambulatory Visit: Payer: Self-pay | Admitting: *Deleted

## 2024-05-24 MED ORDER — DENOSUMAB 60 MG/ML ~~LOC~~ SOSY: 60.0000 mg | PREFILLED_SYRINGE | Freq: Once | SUBCUTANEOUS | 0 refills | Status: AC

## 2024-05-26 NOTE — Telephone Encounter (Signed)
 Pharmacy PA submitted via CoverMyMeds Key: [id number]

## 2024-06-02 ENCOUNTER — Ambulatory Visit: Admitting: Family Medicine

## 2024-06-02 DIAGNOSIS — M810 Age-related osteoporosis without current pathological fracture: Secondary | ICD-10-CM | POA: Diagnosis not present

## 2024-06-02 MED ORDER — DENOSUMAB 60 MG/ML ~~LOC~~ SOSY
60.0000 mg | PREFILLED_SYRINGE | Freq: Once | SUBCUTANEOUS | Status: AC
  Administered 2024-06-02: 60 mg via SUBCUTANEOUS

## 2024-06-02 NOTE — Progress Notes (Unsigned)
 Patient given [name] prolia  injection 60mg /ml in the left arm. Patient tolerated injection well without reaction at the injection site. Pt was here with french interpretor. Patient will schedule next injection, which is 6 months from today.

## 2024-06-03 DIAGNOSIS — S22080A Wedge compression fracture of T11-T12 vertebra, initial encounter for closed fracture: Secondary | ICD-10-CM | POA: Diagnosis not present

## 2024-06-03 DIAGNOSIS — S22080D Wedge compression fracture of T11-T12 vertebra, subsequent encounter for fracture with routine healing: Secondary | ICD-10-CM | POA: Diagnosis not present

## 2024-06-09 DIAGNOSIS — M546 Pain in thoracic spine: Secondary | ICD-10-CM | POA: Diagnosis not present

## 2024-06-17 DIAGNOSIS — M546 Pain in thoracic spine: Secondary | ICD-10-CM | POA: Diagnosis not present

## 2024-06-21 DIAGNOSIS — M546 Pain in thoracic spine: Secondary | ICD-10-CM | POA: Diagnosis not present

## 2024-06-28 ENCOUNTER — Other Ambulatory Visit: Payer: Self-pay | Admitting: Internal Medicine

## 2024-06-28 DIAGNOSIS — M546 Pain in thoracic spine: Secondary | ICD-10-CM | POA: Diagnosis not present

## 2024-06-29 MED ORDER — ISOSORBIDE MONONITRATE ER 30 MG PO TB24: 15.0000 mg | ORAL_TABLET | Freq: Every day | ORAL | 1 refills | Status: AC

## 2024-06-29 MED ORDER — ATORVASTATIN CALCIUM 20 MG PO TABS: 20.0000 mg | ORAL_TABLET | Freq: Every day | ORAL | 1 refills | Status: AC

## 2024-07-01 DIAGNOSIS — M546 Pain in thoracic spine: Secondary | ICD-10-CM | POA: Diagnosis not present

## 2024-07-08 DIAGNOSIS — M546 Pain in thoracic spine: Secondary | ICD-10-CM | POA: Diagnosis not present

## 2024-07-12 DIAGNOSIS — M546 Pain in thoracic spine: Secondary | ICD-10-CM | POA: Diagnosis not present

## 2024-07-15 DIAGNOSIS — M546 Pain in thoracic spine: Secondary | ICD-10-CM | POA: Diagnosis not present

## 2024-07-26 DIAGNOSIS — M546 Pain in thoracic spine: Secondary | ICD-10-CM | POA: Diagnosis not present

## 2024-07-29 DIAGNOSIS — M546 Pain in thoracic spine: Secondary | ICD-10-CM | POA: Diagnosis not present

## 2024-08-02 DIAGNOSIS — M546 Pain in thoracic spine: Secondary | ICD-10-CM | POA: Diagnosis not present

## 2024-08-05 DIAGNOSIS — M546 Pain in thoracic spine: Secondary | ICD-10-CM | POA: Diagnosis not present

## 2024-08-09 ENCOUNTER — Ambulatory Visit: Admitting: Family Medicine

## 2024-08-10 ENCOUNTER — Ambulatory Visit: Admitting: Family Medicine

## 2024-08-26 ENCOUNTER — Ambulatory Visit: Admitting: Primary Care

## 2024-08-26 ENCOUNTER — Encounter: Payer: Self-pay | Admitting: Primary Care

## 2024-08-26 VITALS — BP 130/70 | HR 58 | Temp 97.8°F | Ht 64.0 in | Wt 152.1 lb

## 2024-08-26 DIAGNOSIS — Z23 Encounter for immunization: Secondary | ICD-10-CM | POA: Diagnosis not present

## 2024-08-26 DIAGNOSIS — F411 Generalized anxiety disorder: Secondary | ICD-10-CM

## 2024-08-26 DIAGNOSIS — D229 Melanocytic nevi, unspecified: Secondary | ICD-10-CM | POA: Diagnosis not present

## 2024-08-26 DIAGNOSIS — L659 Nonscarring hair loss, unspecified: Secondary | ICD-10-CM

## 2024-08-26 DIAGNOSIS — J3489 Other specified disorders of nose and nasal sinuses: Secondary | ICD-10-CM | POA: Diagnosis not present

## 2024-08-26 DIAGNOSIS — J3089 Other allergic rhinitis: Secondary | ICD-10-CM | POA: Diagnosis not present

## 2024-08-26 DIAGNOSIS — H938X3 Other specified disorders of ear, bilateral: Secondary | ICD-10-CM | POA: Diagnosis not present

## 2024-08-26 DIAGNOSIS — Z1211 Encounter for screening for malignant neoplasm of colon: Secondary | ICD-10-CM

## 2024-08-26 MED ORDER — MONTELUKAST SODIUM 10 MG PO TABS: 10.0000 mg | ORAL_TABLET | Freq: Every day | ORAL | 0 refills | Status: AC

## 2024-08-26 MED ORDER — ESCITALOPRAM OXALATE 10 MG PO TABS: 10.0000 mg | ORAL_TABLET | Freq: Every day | ORAL | 0 refills | Status: AC

## 2024-08-26 MED ORDER — FLUTICASONE PROPIONATE 50 MCG/ACT NA SUSP: 1.0000 | Freq: Two times a day (BID) | NASAL | 0 refills | Status: AC

## 2024-08-26 NOTE — Assessment & Plan Note (Signed)
 Noted on exam.  Medication list reviewed and there is nothing obvious to cause her hair loss. Will refer to dermatology for further evaluation.

## 2024-08-26 NOTE — Assessment & Plan Note (Signed)
 Will obtain CT sinuses scan. Referral placed to allergist.  Start Flonase  twice daily. Start Singulair 10 mg at bedtime. Continue carbinoxamine  4 mg twice daily.

## 2024-08-26 NOTE — Assessment & Plan Note (Signed)
 Uncontrolled.  Discontinue Zoloft  50 mg per her preference. Start Lexapro 10 mg daily.  We will see her for follow-up in January.

## 2024-08-26 NOTE — Patient Instructions (Addendum)
 Arrtez de prendre du Zoloft  pour Bethany Sharp. Il n'est pas ncessaire de rduire progressivement la dose.  Commencez  prendre du Lexapro pour Bethany Sharp, [place] par [employer].  Matthew devrait recevoir des appels pour son rendez-vous marine scientist, geographical information systems officer (pour [employer] coloscopie), le dermatologue et pour son scanner.  Essayez le spray nasal Flonase  pour les allergies. Vaporisez une fois dans chaque narine, deux fois par jour.  Commencez  prendre du Singulair 10 mg pour les allergies. Prenez un comprim tous les soirs au coucher.  C'tait un plaisir de vous voir !     Stop taking Zoloft  for anxiety. You do not need to wean off.  Start taking Lexapro for anxiety once daily.   Bethany Sharp should receive phone calls for the allergy referral, the GI referral for colonoscopy, the dermatology referral, and the CT scan.  Try Flonase  nasal spray for allergies. Spray 1 spray in each nostril twice daily.  Start Singulair 10 mg for allergies. Take 1 pill everynight at bedtime.

## 2024-08-26 NOTE — Assessment & Plan Note (Signed)
 Uncontrolled and has failed multiple treatments.  Continue carbinoxamine  4 mg twice daily. Start Singulair 10 mg at bedtime Continue Xyzal  5 mg daily. Start Flonase  twice daily  Referral placed for allergist evaluation.

## 2024-08-26 NOTE — Progress Notes (Signed)
 Subjective:    Patient ID: Bethany Sharp, female    DOB: [birth year], 78 y.o.   MRN: [id number]  Bethany Sharp is a very pleasant 78 y.o. female with a history of hypertension, CAD, OSA, hypothyroidism, GERD, GAD, environmental allergies who presents today to discuss multiple concerns.  Her interpreter is with her today as she speaks primarily French.  She is due for colonoscopy in January 2026.  1) Environmental/Seasonal Allergies: Chronic history of environmental allergies with symptoms of rhinorrhea ear fullness, frontal lobe pressure, nasal congestion. Currently managed on carbinoxamine  4 mg twice daily.  She is not sure if she is taking Xyzal .  She is also failed Zyrtec, Claritin, Allegra.  She was once managed on allergy injections. She has never seen an allergist.   She has not undergone a CT sinus scan. She was evaluated by ENT who told her that she needed surgery for her deviated septum.   2) Rash: Chronic to the left anterior lower extremity for which began > 1 year ago.  She has no other rashes elsewhere. She has not tried prescription creams.  She once used a milk-based product in [place], cannot recall the name.  Over the last several months she has noticed hair thinning and loss.  She is unsure of the cause.  She would like to see someone about this.  Evaluated by dermatology earlier this year but did not care for the provider.  3) HJI:Rymnwpr history.  Currently managed on Zoloft  50 mg daily.  Over the last year she doesn't feel that the Zoloft  is effective. Sypmtoms include frustration, worrying, feeling overwhelmed.  She has not tried anything else for treatment.  Review of Systems  HENT:  Positive for congestion, rhinorrhea and sinus pressure.        Bilateral ear fullness  Skin:  Positive for color change and rash.       Hair loss  Allergic/Immunologic: Positive for environmental allergies.  Psychiatric/Behavioral:  The patient is nervous/anxious.          Past  Medical History:  Diagnosis Date   Allergies    Anal fissure    Anxiety    Arthritis    CAD (coronary artery disease)    Clotting disorder    Colon polyps    Distal radius fracture, left 10/18/2022   Diverticulosis    GERD (gastroesophageal reflux disease)    HTN (hypertension)    Hyperlipidemia    Internal hemorrhoids    Left foot pain 09/09/2023   Sleep apnea    wears c-pap   Sleep apnea with use of continuous positive airway pressure (CPAP)    Thyroid  disease    hypothyroidism   Tubular adenoma of colon     Social History   Socioeconomic History   Marital status: Divorced    Spouse name: Not on file   Number of children: 1   Years of education: Not on file   Highest education level: Not on file  Occupational History   Not on file  Tobacco Use   Smoking status: Every Day    Current packs/day: 0.25    Average packs/day: 0.3 packs/day for 63.9 years (16.0 ttl pk-yrs)    Types: Cigarettes    Start date: 1962   Smokeless tobacco: Never   Tobacco comments:    smokes about 4 cigs a day  Vaping Use   Vaping status: Never Used  Substance and Sexual Activity   Alcohol use: Yes    Comment: wine 2 glasses per day  Drug use: Never   Sexual activity: Not on file  Other Topics Concern   Not on file  Social History Narrative   Not on file   Social Drivers of Health   Financial Resource Strain: Not on file  Food Insecurity: Not on file  Transportation Needs: Not on file  Physical Activity: Not on file  Stress: Not on file  Social Connections: Not on file  Intimate Partner Violence: Not on file    Past Surgical History:  Procedure Laterality Date   APPENDECTOMY  1960   CESAREAN SECTION     COLONOSCOPY     tummy tuck      Family History  Problem Relation Age of Onset   Coronary artery disease Mother    Colon cancer Sister    Esophageal cancer Neg Hx    Rectal cancer Neg Hx    Stomach cancer Neg Hx    Pancreatic cancer Neg Hx     No Known  Allergies  Current Outpatient Medications on File Prior to Visit  Medication Sig Dispense Refill   aspirin  EC (ASPIRIN  LOW DOSE) 81 MG tablet TAKE 1 TABLET (81 MG TOTAL) BY MOUTH DAILY. SWALLOW WHOLE. 15 tablet 0   atorvastatin  (LIPITOR) 20 MG tablet Take 1 tablet (20 mg total) by mouth daily. 90 tablet 1   Carbinoxamine  Maleate 4 MG TABS Take 1 tablet (4 mg total) by mouth 2 (two) times daily. For allergies. 180 tablet 3   ibuprofen  (ADVIL ) 600 MG tablet Take 1 tablet (600 mg total) by mouth every 8 (eight) hours as needed. 30 tablet 0   irbesartan  (AVAPRO ) 75 MG tablet Take 1 tablet (75 mg total) by mouth daily. For blood pressure. 90 tablet 3   isosorbide  mononitrate (IMDUR ) 30 MG 24 hr tablet Take 0.5 tablets (15 mg total) by mouth daily. 45 tablet 1   levocetirizine (XYZAL ) 5 MG tablet every evening.     levothyroxine  (SYNTHROID ) 50 MCG tablet TAKE 1 TABLET BY MOUTH EVERY MORNING ON EMPTY STOMACH WITH WATER ONLY. NO OTHER FOOD/MEDS FOR 30 MIN 90 tablet 2   metoprolol  tartrate (LOPRESSOR ) 25 MG tablet Take 1 tablet (25 mg total) by mouth 2 (two) times daily. 180 tablet 3   omeprazole  (PRILOSEC) 40 MG capsule Take 1 capsule (40 mg total) by mouth 2 (two) times daily before a meal. for heartburn. 180 capsule 1   ondansetron  (ZOFRAN -ODT) 4 MG disintegrating tablet Take 1 tablet (4 mg total) by mouth every 8 (eight) hours as needed for nausea or vomiting. 20 tablet 0   traZODone  (DESYREL ) 50 MG tablet TAKE 1 TABLET BY MOUTH AT BEDTIME AS NEEDED FOR SLEEP. 90 tablet 2   No current facility-administered medications on file prior to visit.    BP 130/70   Pulse (!) 58   Temp 97.8 F (36.6 C) (Oral)   Ht 5' 4 (1.626 m)   Wt 152 lb 2 oz (69 kg)   SpO2 97%   BMI 26.11 kg/m  Objective:   Physical Exam Cardiovascular:     Rate and Rhythm: Normal rate and regular rhythm.  Pulmonary:     Effort: Pulmonary effort is normal.     Breath sounds: Normal breath sounds.  Musculoskeletal:      Cervical back: Neck supple.  Skin:    General: Skin is warm and dry.     Comments: Hair thinning noted to entire scalp.  Mild 5 cm circumferential round light red rash to left anterior lower extremity proximal to  ankle.  Neurological:     Mental Status: She is alert and oriented to person, place, and time.  Psychiatric:        Mood and Affect: Mood normal.     Physical Exam        Assessment & Plan:  Environmental and seasonal allergies Assessment & Plan: Uncontrolled and has failed multiple treatments.  Continue carbinoxamine  4 mg twice daily. Start Singulair 10 mg at bedtime Continue Xyzal  5 mg daily. Start Flonase  twice daily  Referral placed for allergist evaluation.  Orders: -     Fluticasone  Propionate; Place 1 spray into both nostrils 2 (two) times daily.  Dispense: 48 g; Refill: 0 -     Ambulatory referral to Allergy -     CT MAXILLOFACIAL WO CONTRAST; Future  Screening for colon cancer -     Ambulatory referral to Gastroenterology  GAD (generalized anxiety disorder) Assessment & Plan: Uncontrolled.  Discontinue Zoloft  50 mg per her preference. Start Lexapro 10 mg daily.  We will see her for follow-up in January.  Orders: -     Escitalopram Oxalate; Take 1 tablet (10 mg total) by mouth daily. For anxiety  Dispense: 90 tablet; Refill: 0  Sinus pressure Assessment & Plan: Chronic and uncontrolled.  Will obtain CT maxillofacial for evaluation of sinuses. Start Flonase  twice daily.  Start Singulair 10 mg at bedtime. Continue carbinoxamine  4 mg twice daily Continue Xyzal  5 mg daily.  Referral placed to allergist.  Orders: -     CT MAXILLOFACIAL WO CONTRAST; Future  Multiple nevi -     Ambulatory referral to Dermatology  Hair loss Assessment & Plan: Noted on exam.  Medication list reviewed and there is nothing obvious to cause her hair loss. Will refer to dermatology for further evaluation.   Need for influenza vaccination -     Flu  vaccine HIGH DOSE PF(Fluzone Trivalent)  Sensation of fullness in both ears Assessment & Plan: Will obtain CT sinuses scan. Referral placed to allergist.  Start Flonase  twice daily. Start Singulair 10 mg at bedtime. Continue carbinoxamine  4 mg twice daily.   Other orders -     Montelukast Sodium; Take 1 tablet (10 mg total) by mouth at bedtime. For allergies  Dispense: 90 tablet; Refill: 0    Assessment and Plan Assessment & Plan    I personally spent a total of 50 minutes in the care of the patient today including preparing to see the patient, getting/reviewing separately obtained history, performing a medically appropriate exam/evaluation, counseling and educating, placing orders, referring and communicating with other health care professionals, and documenting clinical information in the EHR.      Kiaira Pointer K Kathalina Ostermann, NP     History of Present Illness

## 2024-08-26 NOTE — Assessment & Plan Note (Signed)
 Chronic and uncontrolled.  Will obtain CT maxillofacial for evaluation of sinuses. Start Flonase  twice daily.  Start Singulair 10 mg at bedtime. Continue carbinoxamine  4 mg twice daily Continue Xyzal  5 mg daily.  Referral placed to allergist.

## 2024-08-29 ENCOUNTER — Other Ambulatory Visit: Payer: Self-pay | Admitting: Primary Care

## 2024-08-29 DIAGNOSIS — K2100 Gastro-esophageal reflux disease with esophagitis, without bleeding: Secondary | ICD-10-CM

## 2024-08-30 ENCOUNTER — Other Ambulatory Visit: Payer: Self-pay

## 2024-09-02 MED ORDER — METOPROLOL TARTRATE 25 MG PO TABS: 25.0000 mg | ORAL_TABLET | Freq: Two times a day (BID) | ORAL | 1 refills | Status: AC

## 2024-09-07 DIAGNOSIS — S22080D Wedge compression fracture of T11-T12 vertebra, subsequent encounter for fracture with routine healing: Secondary | ICD-10-CM | POA: Diagnosis not present

## 2024-09-25 ENCOUNTER — Other Ambulatory Visit: Payer: Self-pay | Admitting: Primary Care

## 2024-09-25 DIAGNOSIS — E039 Hypothyroidism, unspecified: Secondary | ICD-10-CM

## 2024-09-30 ENCOUNTER — Other Ambulatory Visit: Payer: Self-pay | Admitting: Primary Care

## 2024-09-30 DIAGNOSIS — J3089 Other allergic rhinitis: Secondary | ICD-10-CM

## 2024-09-30 DIAGNOSIS — J3489 Other specified disorders of nose and nasal sinuses: Secondary | ICD-10-CM

## 2024-09-30 DIAGNOSIS — H938X3 Other specified disorders of ear, bilateral: Secondary | ICD-10-CM

## 2024-10-12 ENCOUNTER — Ambulatory Visit
Admission: RE | Admit: 2024-10-12 | Discharge: 2024-10-12 | Disposition: A | Discharge status: Home / Self Care | Source: Ambulatory Visit | Attending: Primary Care | Admitting: Primary Care

## 2024-10-12 DIAGNOSIS — J3089 Other allergic rhinitis: Secondary | ICD-10-CM

## 2024-10-12 DIAGNOSIS — J3489 Other specified disorders of nose and nasal sinuses: Secondary | ICD-10-CM

## 2024-10-12 DIAGNOSIS — H938X3 Other specified disorders of ear, bilateral: Secondary | ICD-10-CM

## 2024-10-23 ENCOUNTER — Other Ambulatory Visit: Payer: Self-pay | Admitting: Primary Care

## 2024-10-25 ENCOUNTER — Telehealth: Payer: Self-pay

## 2024-10-25 ENCOUNTER — Ambulatory Visit: Payer: Self-pay | Admitting: Primary Care

## 2024-10-25 NOTE — Telephone Encounter (Unsigned)
 Copied from CRM #[id number]. Topic: General - Other >> Oct 25, 2024  3:21 PM Alfonso HERO wrote: Reason for CRM: patients son calling to inform that the patient will be needing an interpreter for her upcoming appt.

## 2024-10-26 NOTE — Telephone Encounter (Signed)
 According to interpreter notes will need to use video interpreter services for tomorrow's visit.

## 2024-10-26 NOTE — Telephone Encounter (Signed)
 Noted

## 2024-10-26 NOTE — Telephone Encounter (Addendum)
 Noted. Made note in Appt Notes.  Fyi to [place].

## 2024-10-27 ENCOUNTER — Ambulatory Visit: Payer: Self-pay | Admitting: Primary Care

## 2024-10-27 ENCOUNTER — Encounter: Payer: Self-pay | Admitting: Primary Care

## 2024-10-27 VITALS — BP 118/76 | HR 61 | Temp 98.0°F | Ht 64.0 in | Wt 154.0 lb

## 2024-10-27 DIAGNOSIS — I10 Essential (primary) hypertension: Secondary | ICD-10-CM

## 2024-10-27 DIAGNOSIS — H938X3 Other specified disorders of ear, bilateral: Secondary | ICD-10-CM

## 2024-10-27 DIAGNOSIS — J3089 Other allergic rhinitis: Secondary | ICD-10-CM

## 2024-10-27 DIAGNOSIS — J3489 Other specified disorders of nose and nasal sinuses: Secondary | ICD-10-CM

## 2024-10-27 DIAGNOSIS — Z0000 Encounter for general adult medical examination without abnormal findings: Secondary | ICD-10-CM

## 2024-10-27 DIAGNOSIS — R911 Solitary pulmonary nodule: Secondary | ICD-10-CM

## 2024-10-27 DIAGNOSIS — E039 Hypothyroidism, unspecified: Secondary | ICD-10-CM

## 2024-10-27 DIAGNOSIS — Z800 Family history of malignant neoplasm of digestive organs: Secondary | ICD-10-CM

## 2024-10-27 DIAGNOSIS — I25118 Atherosclerotic heart disease of native coronary artery with other forms of angina pectoris: Secondary | ICD-10-CM

## 2024-10-27 DIAGNOSIS — G4700 Insomnia, unspecified: Secondary | ICD-10-CM

## 2024-10-27 DIAGNOSIS — K2100 Gastro-esophageal reflux disease with esophagitis, without bleeding: Secondary | ICD-10-CM

## 2024-10-27 DIAGNOSIS — F411 Generalized anxiety disorder: Secondary | ICD-10-CM

## 2024-10-27 DIAGNOSIS — E785 Hyperlipidemia, unspecified: Secondary | ICD-10-CM

## 2024-10-27 DIAGNOSIS — G4733 Obstructive sleep apnea (adult) (pediatric): Secondary | ICD-10-CM

## 2024-10-27 LAB — LIPID PANEL
Cholesterol: 183 mg/dL (ref 28–200)
HDL: 73.8 mg/dL
LDL Cholesterol: 69 mg/dL (ref 10–99)
NonHDL: 108.79
Total CHOL/HDL Ratio: 2
Triglycerides: 200 mg/dL — ABNORMAL HIGH (ref 10.0–149.0)
VLDL: 40 mg/dL (ref 0.0–40.0)

## 2024-10-27 LAB — TSH: TSH: 1.55 u[IU]/mL (ref 0.35–5.50)

## 2024-10-27 LAB — HEMOGLOBIN A1C: Hgb A1c MFr Bld: 5.5 % (ref 4.6–6.5)

## 2024-10-27 NOTE — Assessment & Plan Note (Signed)
 Repeat CT chest ordered and pending.

## 2024-10-27 NOTE — Assessment & Plan Note (Signed)
 Controlled.  Continue irbesartan  75 mg daily and metoprolol  tartrate 25 mg BID. CMP reviewed from August 2025.

## 2024-10-27 NOTE — Assessment & Plan Note (Signed)
 Repeat TSH pending.  Continue levothyroxine  50 mcg daily.

## 2024-10-27 NOTE — Assessment & Plan Note (Signed)
 Improved. Continue Lexapro  10mg  daily

## 2024-10-27 NOTE — Progress Notes (Signed)
 "  Subjective:    Patient ID: Bethany Sharp, female    DOB: 26-Dec-1945, 79 y.o.   MRN: [id number]  Bethany Sharp is a very pleasant 79 y.o. female with a history of hypertension, CAD, SVT, OSA, hypothyroidism, GAD, lung nodules, chronic back pain who presents today for complete physical and follow up of chronic conditions.  She has yet to be contacted regarding the allergist and GI referrals that were placed in November 2025. She continues to struggle with allergies. Was previously managed on allergy injections which helped. She's compliant to her Singulair  HS and is taking carbinoxamine  once daily, not twice daily.  Immunizations:  -Influenza: Completed this season   -Shingles: Never Completed Shingrix series -Pneumonia: Never completed   Mammogram: Completed in March 2025 Bone Density Scan: Completed in 2025  Colonoscopy: Completed in 2023, due January 2026 Lung Cancer Screening: Completed CT chest in February 2025, due in February 2026   BP Readings from Last 3 Encounters:  10/27/24 118/76  08/26/24 130/70  05/19/24 (!) 149/83      Review of Systems  Constitutional:  Negative for unexpected weight change.  HENT:  Positive for congestion. Negative for rhinorrhea.   Respiratory:  Negative for cough and shortness of breath.   Cardiovascular:  Negative for chest pain.  Gastrointestinal:  Negative for constipation and diarrhea.  Genitourinary:  Negative for difficulty urinating and menstrual problem.  Musculoskeletal:  Negative for arthralgias and myalgias.  Skin:  Negative for rash.  Allergic/Immunologic: Positive for environmental allergies.  Neurological:  Negative for dizziness and headaches.  Psychiatric/Behavioral:  The patient is not nervous/anxious.          Past Medical History:  Diagnosis Date   Acute left ankle pain 09/09/2023   Acute pain of left knee 09/09/2023   Allergies    Anal fissure    Anxiety    Arthritis    CAD (coronary artery disease)     Clotting disorder    Colon polyps    Distal radius fracture, left 10/18/2022   Diverticulosis    GERD (gastroesophageal reflux disease)    HTN (hypertension)    Hyperlipidemia    Internal hemorrhoids    Left foot pain 09/09/2023   Sleep apnea    wears c-pap   Sleep apnea with use of continuous positive airway pressure (CPAP)    Thyroid  disease    hypothyroidism   Tubular adenoma of colon     Social History   Socioeconomic History   Marital status: Divorced    Spouse name: Not on file   Number of children: 1   Years of education: Not on file   Highest education level: Not on file  Occupational History   Not on file  Tobacco Use   Smoking status: Every Day    Current packs/day: 0.25    Average packs/day: 0.3 packs/day for 64.0 years (16.0 ttl pk-yrs)    Types: Cigarettes    Start date: 1962   Smokeless tobacco: Never   Tobacco comments:    smokes about 4 cigs a day  Vaping Use   Vaping status: Never Used  Substance and Sexual Activity   Alcohol use: Yes    Comment: wine 2 glasses per day   Drug use: Never   Sexual activity: Not on file  Other Topics Concern   Not on file  Social History Narrative   Not on file   Social Drivers of Health   Tobacco Use: High Risk (10/27/2024)   Patient History  Smoking Tobacco Use: Every Day    Smokeless Tobacco Use: Never    Passive Exposure: Not on file  Financial Resource Strain: Not on file  Food Insecurity: Not on file  Transportation Needs: Not on file  Physical Activity: Not on file  Stress: Not on file  Social Connections: Not on file  Intimate Partner Violence: Not on file  Depression (PHQ2-9): Low Risk (08/26/2024)   Depression (PHQ2-9)    PHQ-2 Score: 0  Alcohol Screen: Not on file  Housing: Not on file  Utilities: Not on file  Health Literacy: Not on file    Past Surgical History:  Procedure Laterality Date   APPENDECTOMY  1960   CESAREAN SECTION     COLONOSCOPY     tummy tuck      Family History   Problem Relation Age of Onset   Coronary artery disease Mother    Colon cancer Sister    Esophageal cancer Neg Hx    Rectal cancer Neg Hx    Stomach cancer Neg Hx    Pancreatic cancer Neg Hx     Allergies[1]  Medications Ordered Prior to Encounter[2]  BP 118/76 (BP Location: Right Arm, Patient Position: Sitting, Cuff Size: Normal)   Pulse 61   Temp 98 F (36.7 C) (Temporal)   Ht 5' 4 (1.626 m)   Wt 154 lb (69.9 kg)   SpO2 99%   BMI 26.43 kg/m  Objective:   Physical Exam HENT:     Right Ear: Tympanic membrane and ear canal normal.     Left Ear: Tympanic membrane and ear canal normal.  Eyes:     Pupils: Pupils are equal, round, and reactive to light.  Cardiovascular:     Rate and Rhythm: Normal rate and regular rhythm.  Pulmonary:     Effort: Pulmonary effort is normal.     Breath sounds: Normal breath sounds.  Abdominal:     General: Bowel sounds are normal.     Palpations: Abdomen is soft.     Tenderness: There is no abdominal tenderness.  Musculoskeletal:        General: Normal range of motion.     Cervical back: Neck supple.  Skin:    General: Skin is warm and dry.  Neurological:     Mental Status: She is alert and oriented to person, place, and time.     Cranial Nerves: No cranial nerve deficit.     Deep Tendon Reflexes:     Reflex Scores:      Patellar reflexes are 2+ on the right side and 2+ on the left side. Psychiatric:        Mood and Affect: Mood normal.     Physical Exam        Assessment & Plan:  Preventative health care Assessment & Plan:  Mammogram UTD Bone density UTD Colonoscopy due in February. Referral letter printed (that was placed in November 2025) and advised patient to schedule.   Discussed the importance of a healthy diet and regular exercise in order for weight loss, and to reduce the risk of further co-morbidity.  Exam stable. Labs pending.  Follow up in 1 year for repeat physical.    Hypothyroidism, unspecified  type Assessment & Plan: Repeat TSH pending.  Continue levothyroxine  50 mcg daily.  Orders: -     TSH  Lung nodule Assessment & Plan: Repeat CT chest ordered and pending.   Orders: -     CT CHEST WO CONTRAST; Future  Hyperlipidemia, unspecified  hyperlipidemia type Assessment & Plan: Repeat lipid panel pending.  Work on a healthy diet and regular exercise in order for weight loss, and to reduce the risk of further co-morbidity. Continue atorvastatin  20 mg daily.  Orders: -     Lipid panel -     Hemoglobin A1c  Essential hypertension Assessment & Plan: Controlled.  Continue irbesartan  75 mg daily and metoprolol  tartrate 25 mg BID. CMP reviewed from August 2025.   Coronary artery disease of native artery of native heart with stable angina pectoris Assessment & Plan: Asymptomatic.  Following with cardiology, office notes reviewed from March 2025. Continue Imdur  15 mg daily, aspirin  81 mg daily, atorvastatin  20 mg daily   OSA (obstructive sleep apnea) Assessment & Plan: Continue CPAP nightly   Gastroesophageal reflux disease with esophagitis without hemorrhage Assessment & Plan: Unclear if stable or not.  Continue omeprazole  40 mg daily. Follow up with GI   Sensation of fullness in both ears Assessment & Plan: Continued.   Exam today with obvious bulging without infection.  Will try to expedite the allergist referral.  We also discussed to increase carbinoxamine  to 4 mg BID. If no improvement then we can increase to 8 mg BID. Ultimately, she may need allergy injections.    Sinus pressure Assessment & Plan: Maxillary CT scan overall negative.  Reviewed with patient today.   Insomnia, unspecified type Assessment & Plan: Controlled.  Continue trazodone  50 mg HS   GAD (generalized anxiety disorder) Assessment & Plan: Improved.  Continue Lexapro  10 mg daily   Family history of colon cancer Assessment & Plan: Printed referral letter from  MyChart portal and instructed patient to schedule colonoscopy.    Environmental and seasonal allergies Assessment & Plan: Continued.   Exam today with obvious bulging without infection.  Will try to expedite the allergist referral.  We also discussed to increase carbinoxamine  to 4 mg BID. If no improvement then we can increase to 8 mg BID. Ultimately, she may need allergy injections.      Assessment and Plan Assessment & Plan         Comer MARLA Gaskins, NP        [1] No Known Allergies [2]  Current Outpatient Medications on File Prior to Visit  Medication Sig Dispense Refill   aspirin  EC (ASPIRIN  LOW DOSE) 81 MG tablet TAKE 1 TABLET (81 MG TOTAL) BY MOUTH DAILY. SWALLOW WHOLE. 15 tablet 0   atorvastatin  (LIPITOR) 20 MG tablet Take 1 tablet (20 mg total) by mouth daily. 90 tablet 1   Carbinoxamine  Maleate 4 MG TABS Take 1 tablet (4 mg total) by mouth 2 (two) times daily. For allergies. 180 tablet 3   escitalopram  (LEXAPRO ) 10 MG tablet Take 1 tablet (10 mg total) by mouth daily. For anxiety 90 tablet 0   fluticasone  (FLONASE ) 50 MCG/ACT nasal spray Place 1 spray into both nostrils 2 (two) times daily. 48 g 0   ibuprofen  (ADVIL ) 600 MG tablet Take 1 tablet (600 mg total) by mouth every 8 (eight) hours as needed. 30 tablet 0   irbesartan  (AVAPRO ) 75 MG tablet Take 1 tablet (75 mg total) by mouth daily. For blood pressure. 90 tablet 3   isosorbide  mononitrate (IMDUR ) 30 MG 24 hr tablet Take 0.5 tablets (15 mg total) by mouth daily. 45 tablet 1   levocetirizine (XYZAL ) 5 MG tablet every evening.     levothyroxine  (SYNTHROID ) 50 MCG tablet TAKE 1 TABLET BY MOUTH EVERY MORNING ON EMPTY STOMACH WITH WATER ONLY. NO  OTHER FOOD/MEDS FOR 30 MIN 90 tablet 0   metoprolol  tartrate (LOPRESSOR ) 25 MG tablet Take 1 tablet (25 mg total) by mouth 2 (two) times daily. 180 tablet 1   montelukast  (SINGULAIR ) 10 MG tablet Take 1 tablet (10 mg total) by mouth at bedtime. For allergies 90 tablet  0   omeprazole  (PRILOSEC) 40 MG capsule TAKE 1 CAPSULE (40 MG TOTAL) BY MOUTH 2 (TWO) TIMES DAILY BEFORE A MEAL. FOR HEARTBURN. 180 capsule 0   ondansetron  (ZOFRAN -ODT) 4 MG disintegrating tablet Take 1 tablet (4 mg total) by mouth every 8 (eight) hours as needed for nausea or vomiting. 20 tablet 0   traZODone  (DESYREL ) 50 MG tablet TAKE 1 TABLET BY MOUTH AT BEDTIME AS NEEDED FOR SLEEP. 90 tablet 2   No current facility-administered medications on file prior to visit.   "

## 2024-10-27 NOTE — Assessment & Plan Note (Signed)
 Asymptomatic.  Following with cardiology, office notes reviewed from March 2025. Continue Imdur  15 mg daily, aspirin  81 mg daily, atorvastatin  20 mg daily

## 2024-10-27 NOTE — Assessment & Plan Note (Signed)
" °  Mammogram UTD Bone density UTD Colonoscopy due in February. Referral letter printed (that was placed in November 2025) and advised patient to schedule.   Discussed the importance of a healthy diet and regular exercise in order for weight loss, and to reduce the risk of further co-morbidity.  Exam stable. Labs pending.  Follow up in 1 year for repeat physical.  "

## 2024-10-27 NOTE — Assessment & Plan Note (Signed)
 Unclear if stable or not.  Continue omeprazole  40 mg daily. Follow up with GI

## 2024-10-27 NOTE — Assessment & Plan Note (Signed)
 Maxillary CT scan overall negative.  Reviewed with patient today.

## 2024-10-27 NOTE — Assessment & Plan Note (Signed)
"  Continue CPAP nightly         "

## 2024-10-27 NOTE — Assessment & Plan Note (Signed)
 Continued.   Exam today with obvious bulging without infection.  Will try to expedite the allergist referral.  We also discussed to increase carbinoxamine  to 4 mg BID. If no improvement then we can increase to 8 mg BID. Ultimately, she may need allergy injections.

## 2024-10-27 NOTE — Assessment & Plan Note (Signed)
 Repeat lipid panel pending.  Work on a healthy diet and regular exercise in order for weight loss, and to reduce the risk of further co-morbidity. Continue atorvastatin  20 mg daily.

## 2024-10-27 NOTE — Assessment & Plan Note (Addendum)
 Printed referral letter from MyChart portal and instructed patient to schedule colonoscopy.

## 2024-10-27 NOTE — Assessment & Plan Note (Signed)
 Controlled.  Continue trazodone  50 mg HS

## 2024-10-27 NOTE — Patient Instructions (Addendum)
 Bethany Sharp labs un appel tlphonique pour planifier votre scanner thoracique.  Veuillez appeler le gastro-entrologue pour prendre rendez-vous pour la coloscopie.  Je vais faire mon possible pour que vous [employer] l'allergologue plus rapidement.  Prenez le comprim d'antihistaminique (carbonoxamine) deux fois par jour pour vos allergies. Si cela ne suffit pas, je pourrai augmenter la dose de 4 mg  8 mg.  Veuillez vous rendre au laboratoire aujourd'hui.  Bethany Sharp Bethany Sharp de me contacter via MyChart.  Je vous recontacterai bientt ! C'tait un plaisir de vous voir !   Bethany Sharp will receive a phone call to schedule your CT chest.  Call the GI doctor to schedule the colonoscopy.  I will work to get you in with the allergist sooner.  Take the carbonoxamine allergy pill twice daily for allergies. If this does not help, then I can increase the dose from 4 mg to 8 mg.  Please go to the lab today.   Please have Bethany Sharp contact me on MyChart.   I will be in touch soon! It was lovely to see you!

## 2024-10-29 ENCOUNTER — Ambulatory Visit: Admitting: Primary Care

## 2024-11-02 ENCOUNTER — Other Ambulatory Visit: Payer: Self-pay | Admitting: *Deleted

## 2024-11-04 NOTE — Telephone Encounter (Addendum)
 2026 [employer]  benefits verified- new insurance [employer]  Patient is ready for scheduling on or after: NOT ready to schedule.PA is not complete.   Out-of-pocket cost due at time of visit: see below. OR patient can enroll in [employer] copay assist program. Patient will be allowed up to $1500 assistance.   Primary: [employer] [employer]  co-insurance:$ 80 copay after the deductible is met. Admin fee co-insurance: covered at 100%  Deductible: $6000 ($0 met) [employer] copay assistance will cover up to $1500 of this.   Prior Auth: Required; not yet started. Will wait for MD/Neeton to advise.     ** This summary of benefits is an estimation of the patient's out-of-pocket cost. Exact cost may vary based on individual plan coverage.

## 2024-11-09 NOTE — Telephone Encounter (Signed)
 Cleatrice Ludie SAUNDERS, MD to Me  Moore, Neeton C, CMA (Selected Message)  11/05/24  1:15 PM Donella is an option - her worst T score was -2.1 so figured she wasn't a candidate for it.  But if she is we should see her back to discuss in person before going ahead.

## 2024-12-02 ENCOUNTER — Ambulatory Visit: Payer: Self-pay | Admitting: Allergy

## 2024-12-07 ENCOUNTER — Ambulatory Visit: Admitting: Family Medicine

## 2024-12-21 ENCOUNTER — Encounter: Payer: Self-pay | Admitting: Internal Medicine

## 2025-04-20 ENCOUNTER — Ambulatory Visit: Payer: Self-pay | Admitting: Physician Assistant
